# Patient Record
Sex: Female | Born: 1950
Health system: Southern US, Community
[De-identification: ages and names within clinical notes are randomized; demographics above are authoritative.]

## PROBLEM LIST (undated history)

## (undated) DIAGNOSIS — K8020 Calculus of gallbladder without cholecystitis without obstruction: Secondary | ICD-10-CM

## (undated) DIAGNOSIS — N200 Calculus of kidney: Secondary | ICD-10-CM

## (undated) DIAGNOSIS — G4730 Sleep apnea, unspecified: Secondary | ICD-10-CM

## (undated) DIAGNOSIS — I10 Essential (primary) hypertension: Secondary | ICD-10-CM

## (undated) DIAGNOSIS — M1990 Unspecified osteoarthritis, unspecified site: Secondary | ICD-10-CM

## (undated) DIAGNOSIS — K219 Gastro-esophageal reflux disease without esophagitis: Secondary | ICD-10-CM

## (undated) DIAGNOSIS — K5732 Diverticulitis of large intestine without perforation or abscess without bleeding: Secondary | ICD-10-CM

## (undated) HISTORY — DX: Unspecified osteoarthritis, unspecified site: M19.90

## (undated) HISTORY — PX: ESOPHAGOGASTRODUODENOSCOPY: SHX1529

## (undated) HISTORY — DX: Gastro-esophageal reflux disease without esophagitis: K21.9

## (undated) HISTORY — DX: Calculus of kidney: N20.0

## (undated) HISTORY — PX: COLONOSCOPY: SHX174

## (undated) HISTORY — PX: CHOLECYSTECTOMY: SHX55

## (undated) HISTORY — DX: Essential (primary) hypertension: I10

## (undated) HISTORY — PX: BREAST BIOPSY: SHX20

## (undated) HISTORY — PX: BREAST EXCISIONAL BIOPSY: SUR124

## (undated) HISTORY — DX: Calculus of gallbladder without cholecystitis without obstruction: K80.20

## (undated) HISTORY — PX: OOPHORECTOMY: SHX86

## (undated) HISTORY — PX: TONSILLECTOMY: SUR1361

## (undated) HISTORY — PX: EXPLORATORY LAPAROTOMY: SUR591

## (undated) HISTORY — DX: Diverticulitis of large intestine without perforation or abscess without bleeding: K57.32

## (undated) HISTORY — DX: Sleep apnea, unspecified: G47.30

---

## 1995-09-14 HISTORY — PX: LAPAROTOMY: SHX154

## 1995-09-14 HISTORY — PX: LAPAROSCOPIC ENDOMETRIOSIS FULGURATION: SUR769

## 1998-12-16 ENCOUNTER — Other Ambulatory Visit: Admission: RE | Admit: 1998-12-16 | Discharge: 1998-12-16 | Payer: Self-pay | Admitting: Obstetrics and Gynecology

## 1999-07-16 ENCOUNTER — Ambulatory Visit (HOSPITAL_COMMUNITY): Admission: RE | Admit: 1999-07-16 | Discharge: 1999-07-16 | Payer: Self-pay | Admitting: Emergency Medicine

## 1999-11-19 ENCOUNTER — Encounter: Payer: Self-pay | Admitting: Emergency Medicine

## 1999-11-19 ENCOUNTER — Encounter: Admission: RE | Admit: 1999-11-19 | Discharge: 1999-11-19 | Payer: Self-pay | Admitting: Emergency Medicine

## 1999-11-24 ENCOUNTER — Encounter: Admission: RE | Admit: 1999-11-24 | Discharge: 1999-11-24 | Payer: Self-pay | Admitting: Emergency Medicine

## 1999-11-24 ENCOUNTER — Encounter: Payer: Self-pay | Admitting: Emergency Medicine

## 2000-01-19 ENCOUNTER — Other Ambulatory Visit: Admission: RE | Admit: 2000-01-19 | Discharge: 2000-01-19 | Payer: Self-pay | Admitting: Obstetrics and Gynecology

## 2000-03-30 ENCOUNTER — Encounter: Payer: Self-pay | Admitting: Surgery

## 2000-03-31 ENCOUNTER — Encounter: Payer: Self-pay | Admitting: Surgery

## 2000-03-31 ENCOUNTER — Observation Stay (HOSPITAL_COMMUNITY): Admission: RE | Admit: 2000-03-31 | Discharge: 2000-04-01 | Payer: Self-pay | Admitting: Surgery

## 2000-11-29 ENCOUNTER — Encounter: Payer: Self-pay | Admitting: Emergency Medicine

## 2000-11-29 ENCOUNTER — Encounter: Admission: RE | Admit: 2000-11-29 | Discharge: 2000-11-29 | Payer: Self-pay | Admitting: Emergency Medicine

## 2001-04-07 ENCOUNTER — Other Ambulatory Visit: Admission: RE | Admit: 2001-04-07 | Discharge: 2001-04-07 | Payer: Self-pay | Admitting: Obstetrics and Gynecology

## 2001-08-08 ENCOUNTER — Ambulatory Visit (HOSPITAL_COMMUNITY): Admission: RE | Admit: 2001-08-08 | Discharge: 2001-08-08 | Payer: Self-pay

## 2001-12-12 ENCOUNTER — Encounter: Payer: Self-pay | Admitting: Obstetrics and Gynecology

## 2001-12-12 ENCOUNTER — Encounter: Admission: RE | Admit: 2001-12-12 | Discharge: 2001-12-12 | Payer: Self-pay | Admitting: Obstetrics and Gynecology

## 2002-04-12 ENCOUNTER — Other Ambulatory Visit: Admission: RE | Admit: 2002-04-12 | Discharge: 2002-04-12 | Payer: Self-pay | Admitting: Obstetrics and Gynecology

## 2003-01-01 ENCOUNTER — Encounter: Admission: RE | Admit: 2003-01-01 | Discharge: 2003-01-01 | Payer: Self-pay | Admitting: Obstetrics and Gynecology

## 2003-01-01 ENCOUNTER — Encounter: Payer: Self-pay | Admitting: Obstetrics and Gynecology

## 2003-03-12 ENCOUNTER — Encounter: Payer: Self-pay | Admitting: Gastroenterology

## 2003-04-25 ENCOUNTER — Other Ambulatory Visit: Admission: RE | Admit: 2003-04-25 | Discharge: 2003-04-25 | Payer: Self-pay | Admitting: Obstetrics and Gynecology

## 2003-06-17 ENCOUNTER — Encounter: Admission: RE | Admit: 2003-06-17 | Discharge: 2003-06-17 | Payer: Self-pay | Admitting: Obstetrics and Gynecology

## 2003-06-17 ENCOUNTER — Encounter: Payer: Self-pay | Admitting: Obstetrics and Gynecology

## 2004-01-02 ENCOUNTER — Encounter: Admission: RE | Admit: 2004-01-02 | Discharge: 2004-01-02 | Payer: Self-pay | Admitting: Obstetrics and Gynecology

## 2004-03-11 ENCOUNTER — Encounter: Payer: Self-pay | Admitting: Gastroenterology

## 2005-01-07 ENCOUNTER — Encounter: Admission: RE | Admit: 2005-01-07 | Discharge: 2005-01-07 | Payer: Self-pay | Admitting: Obstetrics and Gynecology

## 2005-06-22 ENCOUNTER — Ambulatory Visit: Payer: Self-pay | Admitting: Gastroenterology

## 2006-01-25 ENCOUNTER — Encounter: Admission: RE | Admit: 2006-01-25 | Discharge: 2006-01-25 | Payer: Self-pay | Admitting: Obstetrics and Gynecology

## 2006-06-09 ENCOUNTER — Encounter: Admission: RE | Admit: 2006-06-09 | Discharge: 2006-06-09 | Payer: Self-pay | Admitting: Obstetrics and Gynecology

## 2006-06-10 ENCOUNTER — Ambulatory Visit: Payer: Self-pay | Admitting: Internal Medicine

## 2006-07-07 ENCOUNTER — Ambulatory Visit: Payer: Self-pay | Admitting: Internal Medicine

## 2007-03-06 ENCOUNTER — Ambulatory Visit (HOSPITAL_COMMUNITY): Admission: RE | Admit: 2007-03-06 | Discharge: 2007-03-06 | Payer: Self-pay | Admitting: Obstetrics and Gynecology

## 2007-03-08 ENCOUNTER — Encounter: Admission: RE | Admit: 2007-03-08 | Discharge: 2007-03-08 | Payer: Self-pay | Admitting: Obstetrics and Gynecology

## 2007-10-24 ENCOUNTER — Ambulatory Visit: Payer: Self-pay | Admitting: Internal Medicine

## 2007-10-25 DIAGNOSIS — Z87898 Personal history of other specified conditions: Secondary | ICD-10-CM | POA: Insufficient documentation

## 2007-10-25 DIAGNOSIS — K449 Diaphragmatic hernia without obstruction or gangrene: Secondary | ICD-10-CM | POA: Insufficient documentation

## 2007-10-25 DIAGNOSIS — I10 Essential (primary) hypertension: Secondary | ICD-10-CM | POA: Insufficient documentation

## 2007-10-25 DIAGNOSIS — N809 Endometriosis, unspecified: Secondary | ICD-10-CM | POA: Insufficient documentation

## 2007-10-25 DIAGNOSIS — E669 Obesity, unspecified: Secondary | ICD-10-CM | POA: Insufficient documentation

## 2007-10-25 DIAGNOSIS — N3010 Interstitial cystitis (chronic) without hematuria: Secondary | ICD-10-CM | POA: Insufficient documentation

## 2007-10-25 DIAGNOSIS — K219 Gastro-esophageal reflux disease without esophagitis: Secondary | ICD-10-CM | POA: Insufficient documentation

## 2007-12-11 ENCOUNTER — Ambulatory Visit (HOSPITAL_COMMUNITY): Admission: RE | Admit: 2007-12-11 | Discharge: 2007-12-11 | Payer: Self-pay | Admitting: Internal Medicine

## 2008-03-18 ENCOUNTER — Encounter: Admission: RE | Admit: 2008-03-18 | Discharge: 2008-03-18 | Payer: Self-pay | Admitting: Obstetrics and Gynecology

## 2009-03-20 ENCOUNTER — Encounter: Admission: RE | Admit: 2009-03-20 | Discharge: 2009-03-20 | Payer: Self-pay | Admitting: Obstetrics and Gynecology

## 2010-04-20 ENCOUNTER — Encounter: Admission: RE | Admit: 2010-04-20 | Discharge: 2010-04-20 | Payer: Self-pay | Admitting: Obstetrics and Gynecology

## 2010-09-13 DIAGNOSIS — K5732 Diverticulitis of large intestine without perforation or abscess without bleeding: Secondary | ICD-10-CM

## 2010-09-13 HISTORY — DX: Diverticulitis of large intestine without perforation or abscess without bleeding: K57.32

## 2010-10-04 ENCOUNTER — Encounter: Payer: Self-pay | Admitting: Obstetrics and Gynecology

## 2011-01-26 NOTE — Assessment & Plan Note (Signed)
[facility]                         GASTROENTEROLOGY OFFICE NOTE   Mckenzie Vasquez, Mckenzie Vasquez              MRN:          [id number]  DATE:10/24/2007                            DOB:          Feb 04, 1951    PROBLEMS:  1. Gastroesophageal reflux disease.  2. Hiatal hernia 6 cm.  Esophagogastroduodenoscopy by Dr. Jarold Motto      March 11, 2004.  3. Normal colonoscopy March 11, 2004.  4. Obesity.  5. Endometriosis with two exploratory laparotomies.  6. Prior cholecystectomy.  7. Fibrocystic breast disease.  8. Prior tonsillectomy.  9. Hypertension.  10.Interstitial cystitis.  11.Asthma and allergic rhinitis.   Ms. Wassmer is doing well, except for some intermittent chest pain which is  rare.  She takes Nexium 40 mg b.i.d. which controls heartburn symptoms.  She remains busy with [employer].  There is no dysphagia.  There is no weight loss, in fact, the weight has increased.  She is  aware of the need to lose weight.   MEDICATIONS:  1. Lisinopril/hydrochlorothiazide 10/12.5 mg daily.  2. Nexium 40 mg b.i.d.  3. Advair 250/50 b.i.d.  4. Calcium two daily.  5. Fish oil two daily.  6. Multivitamin daily.   ALLERGIES:  Allergic to SULFA.   VITAL SIGNS:  Weight 211 pounds, pulse 78, blood pressure 112/84.   ASSESSMENT:  Gastroesophageal reflux disease with known hiatal hernia.  Her symptoms are under good control with the exception of this chest  pain which occurs intermittently.  She says she has had this evaluated  and it is not cardiac.  It could be the hernia.  We spent time,  approximately 15-20 minutes discussing gastroesophageal reflux disease  and hiatal hernia.  She might be one of these people that could benefit  from a hiatal hernia repair.  However, she is obese and weight loss may  help her a lot as well.  We are going to go ahead and check an upper  gastrointestinal series to reassess the hernia itself and make sure  there is  no torsion or volvulus or paraesophageal component which could  be an absolute indication for repair.  She will think about and try to  lose weight.  I will discuss the results of the upper GI with her.  We  will  refill her Nexium for the time being.  I did briefly discuss the process  of a laparoscopic fundoplication and the possible side effects, etc.     Iva Boop, MD,FACG  Electronically Signed    CEG/MedQ  DD: 10/24/2007  DT: 10/26/2007  Job #: [id number]   cc:   [facility]

## 2011-01-29 NOTE — Assessment & Plan Note (Signed)
[facility]                           GASTROENTEROLOGY OFFICE NOTE   TANECIA, MCCAY                       MRN:          [id number]  DATE:07/07/2006                            DOB:          1951/06/06    Ms. Mckenzie Vasquez returns for follow up.  She had a syndrome of pain and change in  bowel habits with mainly diarrhea.  She was treated with Cipro and Flagyl.  We thought she probably had diverticulitis, though diarrhea is not a typical  feature of that.  She says she feels basically resolved, though she has had  some persistent loose stools that have resolved over the last week.   See note of June 10, 2006, for full details of recent problems.   Weight 197 pounds, pulse 84, blood pressure 110/80.  Abdomen is soft,  minimally tender in the left lower quadrant area.   ASSESSMENT:  Recent syndrome, likely was diverticulitis, though cannot be  sure.  At any rate, whatever this was is resolved at this point, it seems.  I think some of her loose stools could have been related to antibiotic  disruption of her bacterial flora of the colon.   PLAN:  Follow up as needed.  Certainly if symptoms recur, she needs to let  us know.     Iva Boop, MD,FACG    CEG/MedQ  DD: 07/07/2006  DT: 07/08/2006  Job #: [id number]

## 2011-01-29 NOTE — Assessment & Plan Note (Signed)
[facility]                           GASTROENTEROLOGY OFFICE NOTE   Mckenzie Vasquez, Vasquez                       MRN:          [id number]  DATE:06/10/2006                            DOB:          10-04-1950    PROBLEM:  Left lower quadrant pain.   HISTORY:  Mckenzie Vasquez Vasquez is a pleasant 60 year old white female known to Dr.  Jarold Motto who had colonoscopy in 2005 which was done for complaint of  abdominal bloating and hemoccult-positive stool.  This was a normal exam,  however, was a very difficult exam due to multiple adhesions.  It was not  noted that she had any evidence of diverticular disease.  Mckenzie Vasquez patient has  history of severe endometriosis and is status post hysterectomy and  oophorectomy.  She says that she started about a week ago with some diarrhea  which was not severe, took Imodium which helped, and those symptoms  resolved.  She did not have any associated fever, chills, etc.  She had some  vague nausea.  Earlier this week, she says she generally just did not feel  good; however, was not having any obvious symptoms.  By Wednesday, however,  she says she started feeling just awful and developed lower abdominal  discomfort which eventually became fairly severe, crampy, left-sided  abdominal pain.  She went to her gynecologist because she had problems with  ovarian cysts in Mckenzie Vasquez past but was told that this did not appear to be a GYN  problem.  She actually has had a left oophorectomy.  She had a UA done which  did show some white cells according to Mckenzie Vasquez patient.  We do not have that  lab.  Was started on Cipro and hydrocodone and then had a renal ultrasound  done yesterday to rule out ureteral lithiasis, and this was negative as  well.  She says she really has not had any symptoms as far as urinary  urgency or frequency or dysuria, but says that if her bladder is full, she  has been a bit uncomfortable.  She continues to have this constant left  lower  quadrant pain which is actually a bit better over Mckenzie Vasquez past 24 hours.  She did have an episode of nausea and vomiting yesterday, none today, and  has kept down liquids today.  She has not had any fever over Mckenzie Vasquez past 24  hours, no diarrhea, no melena or hematochezia.   CURRENT MEDICATIONS:  1. Lisinopril/hydrochlorothiazide daily.  2. Calcium supplement daily.  3. Multivitamins daily.  4. Nexium 40 mg b.i.d.   ALLERGIES:  SULFA causes swelling.   PHYSICAL EXAMINATION:  GENERAL: Well-developed white female in no acute  distress.  VITAL SIGNS:  Weight is 197, pulse 56, blood pressure 108/70.  CARDIOVASCULAR:  Regular rate and rhythm with S1 and S2.  PULMONARY:  Clear.  ABDOMEN: Soft.  She is tender in Mckenzie Vasquez left lower quadrant and mildly in Mckenzie Vasquez  suprapubic area.  There is no guarding or rebound.  Bowel sounds are active.  There is no palpable mass or hepatosplenomegaly.  RECTAL:  Exam was not done today.  IMPRESSION:  42. A 60 year old female with a 3-day history of left lower quadrant pain.      Suspect she does have diverticulitis.  She is improving on Cipro.      Therefore, will continue to treat her with Cipro 500 mg b.i.d. x10      days, Flagyl 250 four times a day x10 days.  She was given a      prescription for Phenergan 25 mg q. 6 h p.r.n. for nausea.  Asked to      start a full liquid diet and then gradually advance over Mckenzie Vasquez next      couple of days.  She is to follow up with Dr. Jarold Motto in 3 weeks and      is asked to call us back on Monday, October 1, with progress report.      If she is not significantly improved at that time, will need CT scan of      Mckenzie Vasquez abdomen and pelvis.      ______________________________  Mike Gip, PA-C    ______________________________  Iva Boop, MD,FACG     AE/MedQ  DD:  06/10/2006  DT:  06/13/2006  Job #:  [id number]

## 2011-03-23 ENCOUNTER — Other Ambulatory Visit: Payer: Self-pay | Admitting: Obstetrics and Gynecology

## 2011-03-23 DIAGNOSIS — Z1231 Encounter for screening mammogram for malignant neoplasm of breast: Secondary | ICD-10-CM

## 2011-05-10 ENCOUNTER — Ambulatory Visit
Admission: RE | Admit: 2011-05-10 | Discharge: 2011-05-10 | Disposition: A | Discharge status: Home / Self Care | Payer: BC Managed Care – PPO | Source: Ambulatory Visit | Attending: Obstetrics and Gynecology | Admitting: Obstetrics and Gynecology

## 2011-05-10 DIAGNOSIS — Z1231 Encounter for screening mammogram for malignant neoplasm of breast: Secondary | ICD-10-CM

## 2011-10-05 ENCOUNTER — Encounter: Payer: Self-pay | Admitting: Internal Medicine

## 2011-10-25 ENCOUNTER — Other Ambulatory Visit: Payer: Self-pay | Admitting: Physician Assistant

## 2011-11-05 ENCOUNTER — Telehealth: Payer: Self-pay | Admitting: *Deleted

## 2011-11-05 ENCOUNTER — Ambulatory Visit (AMBULATORY_SURGERY_CENTER): Payer: BC Managed Care – PPO | Admitting: *Deleted

## 2011-11-05 VITALS — Ht 63.0 in | Wt 214.0 lb

## 2011-11-05 DIAGNOSIS — K5792 Diverticulitis of intestine, part unspecified, without perforation or abscess without bleeding: Secondary | ICD-10-CM

## 2011-11-05 DIAGNOSIS — Z800 Family history of malignant neoplasm of digestive organs: Secondary | ICD-10-CM

## 2011-11-05 DIAGNOSIS — Z1211 Encounter for screening for malignant neoplasm of colon: Secondary | ICD-10-CM

## 2011-11-05 DIAGNOSIS — K5732 Diverticulitis of large intestine without perforation or abscess without bleeding: Secondary | ICD-10-CM

## 2011-11-05 MED ORDER — PEG-KCL-NACL-NASULF-NA ASC-C 100 G PO SOLR: ORAL | Status: DC

## 2011-11-05 NOTE — Telephone Encounter (Signed)
         [name], [name] - 11/05/11 ','<More Detail >>       Iva Boop, MD         Sent:  Mckenzie Vasquez November 05, 2011 3:34 PM   To:  [name], RN                 Message     Looks like three relatives with colon cancer so ok to do colonoscopy I guess she switched to me so ok for me to do colonoscopy ----- Message ----- From: [name], RN Sent: 11/05/2011 9:12 AM To: Iva Boop, MD  Please review patient's chart. You have seen her in the office several times, last colonoscopy with Dr.Patterson 2005 normal. She does have hx diverticulitis and family hx colon cancer. Pt denies any GI symptoms at this time but she was told to have colonoscopy during her previous office visit per pt. I'm unsure when her recall is unable to find in chart. Is it okay for patient to have colonoscopy at this time? Is it okay she is scheduled with you not Dr.Patterson?(pt was fine with you doing her colonoscopy since she has seen you in office) please advise,Demarie Uhlig [name]         Attached Reports     The sender attached the following reports to this message:

## 2011-11-16 ENCOUNTER — Ambulatory Visit (AMBULATORY_SURGERY_CENTER): Payer: BC Managed Care – PPO | Admitting: Internal Medicine

## 2011-11-16 ENCOUNTER — Encounter: Payer: Self-pay | Admitting: Internal Medicine

## 2011-11-16 VITALS — BP 139/84 | HR 80 | Temp 98.2°F | Resp 19 | Ht 63.0 in | Wt 214.0 lb

## 2011-11-16 DIAGNOSIS — Z1211 Encounter for screening for malignant neoplasm of colon: Secondary | ICD-10-CM

## 2011-11-16 DIAGNOSIS — R109 Unspecified abdominal pain: Secondary | ICD-10-CM

## 2011-11-16 MED ORDER — SODIUM CHLORIDE 0.9 % IV SOLN: 500.0000 mL | INTRAVENOUS | Status: DC

## 2011-11-16 NOTE — Patient Instructions (Signed)

## 2011-11-16 NOTE — Progress Notes (Signed)
UNABLE TO ADVANCE SCOPE , CHANGE TO ADULT SCOPE AFTER 12 MINS

## 2011-11-16 NOTE — Progress Notes (Signed)
Patient did not experience any of the following events: a burn prior to discharge; a fall within the facility; wrong site/side/patient/procedure/implant event; or a hospital transfer or hospital admission upon discharge from the facility. (G8907) Patient did not have preoperative order for IV antibiotic SSI prophylaxis. (G8918) Patient did not have preoperative order for IV antibiotic SSI prophylaxis. (G8918)  

## 2011-11-16 NOTE — Progress Notes (Signed)
CHANGE TO ADULT SCOPE, UNABLE TO COMPLETE EXAM RELATED TO PATIENT DISCOMFORT. ONCE SCOPE WITHDRAWN, PATIENT SLEEPING. ABD SOFT, SKIN WARM DRY PINK, VS STABLE.

## 2011-11-16 NOTE — Op Note (Signed)
[facility] [address] [place], [place]  [address]  COLONOSCOPY PROCEDURE REPORT  PATIENT:  Mckenzie Vasquez, Mckenzie Vasquez  MR#:  [id number] BIRTHDATE:  11-21-1950, 60 yrs. old  GENDER:  female ENDOSCOPIST:  Iva Boop, MD, [facility] REF. BY:  [name], M.D. PROCEDURE DATE:  11/16/2011 PROCEDURE:  Incomplete colonoscopy ASA CLASS:  Class II INDICATIONS:  Screening patient presented for screening in setting of recurrent abdominal pain (chronic) thought to be diverticulitis  MEDICATIONS:   These medications were titrated to patient response per physician's verbal order, Fentanyl 100 mcg, Versed 10 mg IV, Benadryl 12.5 mg IV  DESCRIPTION OF PROCEDURE:   After the risks benefits and alternatives of the procedure were thoroughly explained, informed consent was obtained.  Digital rectal exam was performed and revealed no abnormalities.   The LB [id number]-[id number] endoscope was introduced through the anus and advanced to the descending colon, limited by extreme patient discomfort, a redundant colon. The quality of the prep was excellent, using MoviPrep.  The instrument was then slowly withdrawn as the colon was fully examined. <<PROCEDUREIMAGES>>  FINDINGS:  Normal left colon and rectum. No diverticulosis.Extremely uncomfortable with scope passage in left colon. Pediatric and adult colonoscopes used, position changes and pressure used but not successful at moving beyond descending colon.  Retroflexed views in the rectum revealed no abnormalities. The scope was then withdrawn and the procedure completed. COMPLICATIONS:  None ENDOSCOPIC IMPRESSION: 1) Normal left colon - did not tolerate exam beyond this point. 2) No left-sided diverticulosis seen RECOMMENDATIONS: Schedule an office visit to discuss abdominal pain and to consider repeating colonoscopy exam - with MAC sedation.  Iva Boop, MD, Clementeen Graham  CC:  [name], MD, Olivia Mackie, MD and The  Patient  n. eSIGNED:   Iva Boop at 11/16/2011 01:01 PM  Rosie Fate, [phone]

## 2011-11-17 ENCOUNTER — Telehealth: Payer: Self-pay | Admitting: *Deleted

## 2011-11-17 NOTE — Telephone Encounter (Signed)
  Follow up Call-  Call back number 11/16/2011  Post procedure Call Back phone  # [phone] ext [phone]  Permission to leave phone message Yes     Patient questions:  Do you have a fever, pain , or abdominal swelling? no Pain Score  0 *  Have you tolerated food without any problems? yes  Have you been able to return to your normal activities? yes  Do you have any questions about your discharge instructions: Diet   no Medications  no Follow up visit  no  Do you have questions or concerns about your Care? no  Actions: * If pain score is 4 or above: No action needed, pain <4.

## 2011-11-28 ENCOUNTER — Ambulatory Visit (INDEPENDENT_AMBULATORY_CARE_PROVIDER_SITE_OTHER): Payer: BC Managed Care – PPO | Admitting: Family Medicine

## 2011-11-28 VITALS — BP 120/81 | HR 109 | Temp 98.1°F | Resp 20 | Ht 63.0 in | Wt 212.0 lb

## 2011-11-28 DIAGNOSIS — R062 Wheezing: Secondary | ICD-10-CM

## 2011-11-28 DIAGNOSIS — J40 Bronchitis, not specified as acute or chronic: Secondary | ICD-10-CM

## 2011-11-28 MED ORDER — DOXYCYCLINE HYCLATE 100 MG PO CAPS: 100.0000 mg | ORAL_CAPSULE | Freq: Two times a day (BID) | ORAL | Status: AC

## 2011-11-28 NOTE — Progress Notes (Signed)
  Patient Name: Mckenzie Vasquez Date of Birth: Apr 26, 1951 Medical Record Number: [id number] Gender: female Date of Encounter: 11/28/2011  History of Present Illness:  Mckenzie Vasquez is a 61 y.o. very pleasant female patient who presents with the following:  Noted the onset of "cold" type symptoms about 3 days ago- temperature up to about 99 degrees.  She has noted body aches as well, nasal congestion, cough- non- productive.   Had a scratchy throat - this is better.  No earache.  No stomach symptoms.  Her symptoms are affecting her asthma.    She used albuterol prn and does have some advair but does not use it regularly.  She is actually out of albuterol  She has felt very, very tired and notes that her right lung feels congested.    Patient Active Problem List  Diagnoses  . OBESITY  . HYPERTENSION, BENIGN  . GERD  . HIATAL HERNIA  . INTERSTITIAL CYSTITIS  . ENDOMETRIOSIS  . FIBROCYSTIC BREAST DISEASE, HX OF   Past Medical History  Diagnosis Date  . Hypertension   . GERD (gastroesophageal reflux disease)   . Asthma   . Diverticulitis large intestine 2012    pt. states this is ongoing and sometimes worse than others   Past Surgical History  Procedure Date  . Cholecystectomy   . Breast biopsy     left  . Tonsillectomy   . Exploratory laparotomy     x2; oophorectomyx1  . Laparotomy   . Laparoscopic endometriosis fulguration 1997   History  Substance Use Topics  . Smoking status: Never Smoker   . Smokeless tobacco: Never Used  . Alcohol Use: No   Family History  Problem Relation Age of Onset  . Colon cancer Maternal Grandfather   . Colon cancer Other   . Colon cancer Paternal Grandfather    Allergies  Allergen Reactions  . Sulfa Antibiotics Rash    Medication list has been reviewed and updated.  Review of Systems: As per HPI- otherwise negative. She took some dayquil this am  Physical Examination: Filed Vitals:   11/28/11 1646  BP: 120/81  Pulse: 109    Temp: 98.1 F (36.7 C)  TempSrc: Oral  Resp: 20  Height: 5\' 3"  (1.6 m)  Weight: 212 lb (96.163 kg)    Body mass index is 37.55 kg/(m^2).  GEN: WDWN, NAD, Non-toxic, A & O x 3, obese HEENT: Atraumatic, Normocephalic. Neck supple. No masses, No LAD.  TM wnl, oropharynx wnl Ears and Nose: No external deformity. CV: RRR, No M/G/R. No JVD. No thrill. No extra heart sounds. PULM: minimal ronchi and expiratory wheezes. No retractions. No resp. distress. No accessory muscle use. ABD: S, NT, ND, +BS. No rebound. No HSM. EXTR: No c/c/e NEURO Normal gait.  PSYCH: Normally interactive. Conversant. Not depressed or anxious appearing.  Calm demeanor.   Assessment and Plan: 1. Bronchitis  doxycycline (VIBRAMYCIN) 100 MG capsule  2. Wheezing     Cover with doxy for bronchitis as above. Patient (or parent if minor) instructed to return to clinic or call if not better in 2-3 day(s).

## 2011-11-29 ENCOUNTER — Other Ambulatory Visit: Payer: Self-pay | Admitting: Internal Medicine

## 2011-11-29 MED ORDER — FLUTICASONE-SALMETEROL 250-50 MCG/DOSE IN AEPB: 1.0000 | INHALATION_SPRAY | Freq: Two times a day (BID) | RESPIRATORY_TRACT | Status: DC

## 2011-11-30 ENCOUNTER — Other Ambulatory Visit: Payer: Self-pay

## 2011-11-30 MED ORDER — MOMETASONE FURO-FORMOTEROL FUM 200-5 MCG/ACT IN AERO: 1.0000 | INHALATION_SPRAY | Freq: Two times a day (BID) | RESPIRATORY_TRACT | Status: DC

## 2012-02-15 ENCOUNTER — Encounter: Payer: Self-pay | Admitting: Internal Medicine

## 2012-05-03 ENCOUNTER — Other Ambulatory Visit: Payer: Self-pay | Admitting: Obstetrics and Gynecology

## 2012-05-03 DIAGNOSIS — Z1231 Encounter for screening mammogram for malignant neoplasm of breast: Secondary | ICD-10-CM

## 2012-05-05 ENCOUNTER — Ambulatory Visit (INDEPENDENT_AMBULATORY_CARE_PROVIDER_SITE_OTHER): Payer: BC Managed Care – PPO | Admitting: Family Medicine

## 2012-05-05 VITALS — BP 118/70 | HR 94 | Temp 98.7°F | Resp 16 | Ht 63.0 in | Wt 197.6 lb

## 2012-05-05 DIAGNOSIS — K649 Unspecified hemorrhoids: Secondary | ICD-10-CM

## 2012-05-05 DIAGNOSIS — L290 Pruritus ani: Secondary | ICD-10-CM

## 2012-05-05 MED ORDER — HYDROCORTISONE 2.5 % RE CREA: TOPICAL_CREAM | Freq: Two times a day (BID) | RECTAL | Status: AC

## 2012-05-05 MED ORDER — HYDROCORTISONE ACETATE 30 MG RE SUPP: 1.0000 | Freq: Two times a day (BID) | RECTAL | Status: DC

## 2012-05-05 NOTE — Progress Notes (Signed)
[facility]:  Office Visit  Chief Complaint:  Chief Complaint  Patient presents with  . Rectal Pain    irritation is spreading to vagina, burning when urinate x 1 week    HPI: Mckenzie Vasquez is a 61 y.o. female who complains of  hemorrhoids and anal itching. Sitz  baths and OTC hydrocortisone cream, fiber and water without relief.   Past Medical History  Diagnosis Date  . Hypertension   . GERD (gastroesophageal reflux disease)   . Asthma   . Diverticulitis large intestine 2012    pt. states this is ongoing and sometimes worse than others   Past Surgical History  Procedure Date  . Cholecystectomy   . Breast biopsy     left  . Tonsillectomy   . Exploratory laparotomy     x2; oophorectomyx1  . Laparotomy   . Laparoscopic endometriosis fulguration 1997   History   Social History  . Marital Status: Married    Spouse Name: N/A    Number of Children: N/A  . Years of Education: N/A   Social History Main Topics  . Smoking status: Never Smoker   . Smokeless tobacco: Never Used  . Alcohol Use: No  . Drug Use: No  . Sexually Active:    Other Topics Concern  . None   Social History Narrative  . None   Family History  Problem Relation Age of Onset  . Colon cancer Maternal Grandfather   . Colon cancer Other   . Colon cancer Paternal Grandfather    Allergies  Allergen Reactions  . Sulfa Antibiotics Rash   Prior to Admission medications   Medication Sig Start Date End Date Taking? Authorizing Provider  Albuterol Sulfate (PROAIR HFA IN) Inhale 1 Inhaler into the lungs as needed.   Yes Historical Provider, MD  esomeprazole (NEXIUM) 40 MG capsule Take 40 mg by mouth daily before breakfast.   Yes Historical Provider, MD  Estradiol (VAGIFEM VA) Place 1 application vaginally daily.   Yes Historical Provider, MD  Fluticasone-Salmeterol (ADVAIR DISKUS) 250-50 MCG/DOSE AEPB Inhale 1 puff into the lungs 2 (two) times daily. 11/29/11  Yes Rickard Patience,  PA-C  lisinopril (PRINIVIL,ZESTRIL) 10 MG tablet Take 10 mg by mouth daily.   Yes Historical Provider, MD  OPTIVAR 0.05 % ophthalmic solution INSTILL 1 DROP IN [facility] EYE TWICE DAILY. 10/25/11  Yes Ryan M Dunn, PA-C  hydrocortisone (ANUSOL-HC) 2.5 % rectal cream Place rectally 2 (two) times daily. 05/05/12 05/15/12  Kema Santaella P Samrat Hayward, DO  HYDROCORTISONE ACE, RECTAL, 30 MG SUPP Place 1 suppository (30 mg total) rectally 2 (two) times daily. 05/05/12   Antonieta Slaven P Esmond Hinch, DO  Mometasone Furo-Formoterol Fum 200-5 MCG/ACT AERO Inhale 1 puff into the lungs 2 (two) times daily. 11/30/11   Rickard Patience, PA-C     ROS: The patient denies fevers, chills, night sweats, unintentional weight loss, chest pain, palpitations, wheezing, dyspnea on exertion, nausea, vomiting, abdominal pain, dysuria, hematuria, melena, numbness, weakness, or tingling.  All other systems have been reviewed and were otherwise negative with the exception of those mentioned in the HPI and as above.    PHYSICAL EXAM: Filed Vitals:   05/05/12 1553  BP: 118/70  Pulse: 94  Temp: 98.7 F (37.1 C)  Resp: 16   Filed Vitals:   05/05/12 1553  Height: 5\' 3"  (1.6 m)  Weight: 197 lb 9.6 oz (89.631 kg)   Body mass index is 35.00 kg/(m^2).  General: Alert, no acute distress  HEENT:  Normocephalic, atraumatic, oropharynx patent.  Cardiovascular:  Regular rate and rhythm, no rubs murmurs or gallops.  No Carotid bruits, radial pulse intact. No pedal edema.  Respiratory: Clear to auscultation bilaterally.  No wheezes, rales, or rhonchi.  No cyanosis, no use of accessory musculature GI: No organomegaly, abdomen is soft and non-tender, positive bowel sounds.  No masses. Skin: + hemorrhoid, red rash on anus, not thrombosed hemorrhoid. Neurologic: Facial musculature symmetric. Psychiatric: Patient is appropriate throughout our interaction. Lymphatic: No cervical lymphadenopathy Musculoskeletal: Gait intact.   LABS: No results found for this or any previous  visit.   EKG/XRAY:   Primary read interpreted by [facility] Conley Rolls at [facility].   ASSESSMENT/PLAN: Encounter Diagnoses  Name Primary?  Marland Kitchen Anal itching Yes  . Hemorrhoids     Rx Hydrocortisone cream and suppositories. She wants to hold off on getting lidocaine or dibucaine ointment if worse   Asmara Backs PHUONG, DO 05/05/2012 4:42 PM

## 2012-05-10 ENCOUNTER — Telehealth: Payer: Self-pay

## 2012-05-10 MED ORDER — HYDROCORTISONE 2.5 % RE CREA: TOPICAL_CREAM | Freq: Two times a day (BID) | RECTAL | Status: AC

## 2012-05-10 NOTE — Telephone Encounter (Signed)
I have called patient to advise it has been sent in/ per Dr Conley Rolls.

## 2012-05-10 NOTE — Telephone Encounter (Signed)
Pt states she received a 2 refills of HYDROCORTISONE ACE, RECTAL, 30 MG SUPP [[id number]] but no refills on the hydrocortisone (ANUSOL-HC) 2.5 % rectal cream [[id number]]  which Dr. Conley Rolls mentioned she would do. Pt needs a refill of the cream.  [facility] - [place], [place] - [address]  Best [phone]

## 2012-05-24 ENCOUNTER — Ambulatory Visit
Admission: RE | Admit: 2012-05-24 | Discharge: 2012-05-24 | Disposition: A | Discharge status: Home / Self Care | Payer: BC Managed Care – PPO | Source: Ambulatory Visit | Attending: Obstetrics and Gynecology | Admitting: Obstetrics and Gynecology

## 2012-05-24 DIAGNOSIS — Z1231 Encounter for screening mammogram for malignant neoplasm of breast: Secondary | ICD-10-CM

## 2012-07-01 ENCOUNTER — Ambulatory Visit (INDEPENDENT_AMBULATORY_CARE_PROVIDER_SITE_OTHER): Payer: BC Managed Care – PPO | Admitting: *Deleted

## 2012-07-01 DIAGNOSIS — Z23 Encounter for immunization: Secondary | ICD-10-CM

## 2012-09-22 ENCOUNTER — Other Ambulatory Visit: Payer: Self-pay | Admitting: Radiology

## 2012-09-22 NOTE — Telephone Encounter (Signed)
Please advise on refill of Protozone 2.5% cream pended.

## 2012-11-07 ENCOUNTER — Encounter: Payer: Self-pay | Admitting: Internal Medicine

## 2012-11-07 ENCOUNTER — Ambulatory Visit (INDEPENDENT_AMBULATORY_CARE_PROVIDER_SITE_OTHER): Payer: BC Managed Care – PPO | Admitting: Internal Medicine

## 2012-11-07 VITALS — BP 155/87 | HR 86 | Temp 98.5°F | Resp 16 | Ht 63.0 in | Wt 216.0 lb

## 2012-11-07 DIAGNOSIS — M79643 Pain in unspecified hand: Secondary | ICD-10-CM

## 2012-11-07 DIAGNOSIS — M25549 Pain in joints of unspecified hand: Secondary | ICD-10-CM

## 2012-11-07 DIAGNOSIS — M2550 Pain in unspecified joint: Secondary | ICD-10-CM

## 2012-11-07 DIAGNOSIS — G2582 Stiff-man syndrome: Secondary | ICD-10-CM

## 2012-11-07 DIAGNOSIS — J45909 Unspecified asthma, uncomplicated: Secondary | ICD-10-CM | POA: Insufficient documentation

## 2012-11-07 DIAGNOSIS — Z6838 Body mass index (BMI) 38.0-38.9, adult: Secondary | ICD-10-CM | POA: Insufficient documentation

## 2012-11-07 DIAGNOSIS — R5381 Other malaise: Secondary | ICD-10-CM

## 2012-11-07 DIAGNOSIS — R5383 Other fatigue: Secondary | ICD-10-CM

## 2012-11-07 LAB — POCT CBC
Granulocyte percent: 51.7 %G (ref 37–80)
Lymph, poc: 3.6 — AB (ref 0.6–3.4)
MCH, POC: 28.9 pg (ref 27–31.2)
MCHC: 32 g/dL (ref 31.8–35.4)
MCV: 90.3 fL (ref 80–97)
MID (cbc): 0.7 (ref 0–0.9)
POC LYMPH PERCENT: 40.5 %L (ref 10–50)
Platelet Count, POC: 335 10*3/uL (ref 142–424)
RDW, POC: 13.5 %

## 2012-11-07 NOTE — Progress Notes (Addendum)
Subjective:    Patient ID: Mckenzie Vasquez, female    DOB: 03-03-1951, 62 y.o.   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 Currently affecting hands, right greater than left (she is right-handed). Has had problems with both feet but not currently. Has also had marked stiffness in the back. Has had problems with the left knee, the right he, the left heel, and the right elbow. Never swollen. Never red or hot. No family history of arthritis.  Sleep= snores loudly/no problems driving/no problems reading in the afternoon/occasionally falls asleep watching TV  This has taken place over the last 6-8 months. She also has easy fatigability with activity. There is no shortness of breath.  Past medical history includes Patient Active Problem List  Diagnosis  . OBESITY  . HYPERTENSION, BENIGN  . GERD  . HIATAL HERNIA  . INTERSTITIAL CYSTITIS  . ENDOMETRIOSIS  . FIBROCYSTIC BREAST DISEASE, HX OF    -  Asthma  Medication includes lisinopril, Nexium, albuterol, Advair    Review of Systems  Constitutional: Negative for fever, chills, activity change, appetite change and unexpected weight change.  HENT: Negative for hearing loss, trouble swallowing, neck pain and neck stiffness.   Respiratory: Negative for choking.   Cardiovascular: Negative for chest pain, palpitations and leg swelling.  Gastrointestinal: Negative for nausea, diarrhea and constipation.  Endocrine: Negative for cold intolerance, heat intolerance, polydipsia, polyphagia and polyuria.  Genitourinary: Negative for frequency and difficulty urinating.       UTI in January  Skin: Negative for rash.  Neurological: Negative for dizziness, tremors, weakness, light-headedness and headaches.  Psychiatric/Behavioral: Negative for agitation. The patient is not nervous/anxious.        Objective:   Physical  Exam Blood pressure 155/87 BMI 38 HEENT clear without thyromegaly or lymphadenopathy Heart regular without murmur Extremities-good peripheral pulses Trace edema in the lower chimneys Right elbow exam normal Right hand with tenderness in MCPs 2 and 3 and 4/left MCPs 3 and 4-no swelling or redness Tinel's is negative/no wrist pain with range of motion Brachioradialis nontender Shoulders with good range of motion without pain  Neck with good range of motion without pain No sensory or motor losses in the hands/radial pulses intact/no muscle atrophy Left Achilles nontender/this was a site of pain recently       Assessment & Plan:  Problem #1 multiple arthralgias  Problem #2 stiffness  Problem #3 poor sleep  Problem #4 fatigue  Problem #5 HTN-not currently controlled Problem #6 BMI 38  The differential could include polymyalgia rheumatica, myasthenia gravis, other autoimmune phenomena, other rheumatoid disorders, sleep deprivation syndrome. Start with routine labs  Results for orders placed in visit on 11/07/12  POCT CBC      Result Value Range   WBC 8.9  4.6 - 10.2 K/uL   Lymph, poc 3.6 (*) 0.6 - 3.4   POC LYMPH PERCENT 40.5  10 - 50 %L   MID (cbc) 0.7  0 - 0.9   POC MID % 7.8  0 - 12 %M   POC Granulocyte 4.6  2 - 6.9   Granulocyte percent 51.7  37 - 80 %G   RBC 4.74  4.04 - 5.48 M/uL   Hemoglobin 13.7  12.2 - 16.2 g/dL   HCT, POC 14.7  82.9 - 47.9 %   MCV 90.3  80 - 97 fL   MCH, POC 28.9  27 - 31.2 pg   MCHC 32.0  31.8 - 35.4 g/dL   RDW, POC 09.8     Platelet Count, POC 335  142 - 424 K/uL   MPV 8.3  0 - 99.8 fL  POCT SEDIMENTATION RATE      Result Value Range   POCT SED RATE 10  0 - 22 mm/hr     Addend 11/15/12: Better this week Labs wnl Start diary of pain and trial of melox 15 To [place] 12/04/13

## 2012-11-08 LAB — TSH: TSH: 3.202 u[IU]/mL (ref 0.350–4.500)

## 2012-11-08 LAB — COMPREHENSIVE METABOLIC PANEL
Alkaline Phosphatase: 50 U/L (ref 39–117)
BUN: 15 mg/dL (ref 6–23)
CO2: 29 mEq/L (ref 19–32)
Creat: 0.87 mg/dL (ref 0.50–1.10)
Glucose, Bld: 87 mg/dL (ref 70–99)
Total Bilirubin: 0.3 mg/dL (ref 0.3–1.2)

## 2012-11-08 LAB — RHEUMATOID FACTOR: Rhuematoid fact SerPl-aCnc: <10 IU/mL (ref <=14)

## 2012-11-08 LAB — URIC ACID: Uric Acid, Serum: 6.5 mg/dL (ref 2.4–7.0)

## 2012-11-08 LAB — T4, FREE: Free T4: 1.21 ng/dL (ref 0.80–1.80)

## 2012-11-09 LAB — ANA: Anti Nuclear Antibody(ANA): NEGATIVE

## 2012-11-15 ENCOUNTER — Telehealth: Payer: Self-pay | Admitting: Internal Medicine

## 2012-11-15 ENCOUNTER — Encounter: Payer: Self-pay | Admitting: Internal Medicine

## 2012-11-15 MED ORDER — MELOXICAM 15 MG PO TABS: 15.0000 mg | ORAL_TABLET | Freq: Every day | ORAL | Status: DC

## 2012-11-15 NOTE — Telephone Encounter (Signed)
Message copied by Tonye Pearson on Wed Nov 15, 2012 10:41 AM ------      Message from: Tonye Pearson      Created: Tue Nov 14, 2012 10:04 PM       call ------

## 2012-11-15 NOTE — Addendum Note (Signed)
Addended by: Tonye Pearson on: 11/15/2012 01:17 PM   Modules accepted: Orders

## 2012-11-28 ENCOUNTER — Telehealth: Payer: Self-pay

## 2012-11-28 DIAGNOSIS — K649 Unspecified hemorrhoids: Secondary | ICD-10-CM

## 2012-11-28 DIAGNOSIS — L290 Pruritus ani: Secondary | ICD-10-CM

## 2012-11-28 NOTE — Telephone Encounter (Signed)
Pharmacy is requesting RFs for pt's hydrocortisone 25 mg for Anusol 25 mg suppositories. Dr Conley Rolls do you want to RF and for how many months?

## 2012-11-29 MED ORDER — HYDROCORTISONE ACETATE 25 MG RE SUPP: 25.0000 mg | Freq: Two times a day (BID) | RECTAL | Status: DC

## 2012-12-30 ENCOUNTER — Ambulatory Visit (INDEPENDENT_AMBULATORY_CARE_PROVIDER_SITE_OTHER): Payer: BC Managed Care – PPO | Admitting: Emergency Medicine

## 2012-12-30 ENCOUNTER — Ambulatory Visit: Payer: BC Managed Care – PPO

## 2012-12-30 VITALS — BP 110/82 | HR 110 | Temp 99.1°F | Resp 16 | Ht 64.0 in | Wt 214.2 lb

## 2012-12-30 DIAGNOSIS — R05 Cough: Secondary | ICD-10-CM

## 2012-12-30 DIAGNOSIS — R059 Cough, unspecified: Secondary | ICD-10-CM

## 2012-12-30 DIAGNOSIS — J45909 Unspecified asthma, uncomplicated: Secondary | ICD-10-CM

## 2012-12-30 DIAGNOSIS — J3489 Other specified disorders of nose and nasal sinuses: Secondary | ICD-10-CM

## 2012-12-30 DIAGNOSIS — J209 Acute bronchitis, unspecified: Secondary | ICD-10-CM

## 2012-12-30 DIAGNOSIS — R0981 Nasal congestion: Secondary | ICD-10-CM

## 2012-12-30 LAB — POCT CBC
Granulocyte percent: 67.2 %G (ref 37–80)
HCT, POC: 45.8 % (ref 37.7–47.9)
Hemoglobin: 15 g/dL (ref 12.2–16.2)
Lymph, poc: 2.2 (ref 0.6–3.4)
MCH, POC: 29.3 pg (ref 27–31.2)
MCHC: 32.8 g/dL (ref 31.8–35.4)
MCV: 89.5 fL (ref 80–97)
MID (cbc): 0.7 (ref 0–0.9)
MPV: 8.1 fL (ref 0–99.8)
POC Granulocyte: 5.9 (ref 2–6.9)
POC LYMPH PERCENT: 25.2 %L (ref 10–50)
POC MID %: 7.6 %M (ref 0–12)
Platelet Count, POC: 371 10*3/uL (ref 142–424)
RBC: 5.12 M/uL (ref 4.04–5.48)
RDW, POC: 13.2 %
WBC: 8.8 10*3/uL (ref 4.6–10.2)

## 2012-12-30 MED ORDER — IPRATROPIUM BROMIDE 0.03 % NA SOLN: 2.0000 | Freq: Two times a day (BID) | NASAL | Status: DC

## 2012-12-30 MED ORDER — BENZONATATE 100 MG PO CAPS: 100.0000 mg | ORAL_CAPSULE | Freq: Three times a day (TID) | ORAL | Status: DC | PRN

## 2012-12-30 MED ORDER — AZITHROMYCIN 250 MG PO TABS: ORAL_TABLET | ORAL | Status: DC

## 2012-12-30 NOTE — Progress Notes (Signed)
Subjective:    Patient ID: Mckenzie Vasquez, female    DOB: 09/27/1950, 62 y.o.   MRN: [id number]  HPI 62 year old female presents with 1 week history of nasal congestion, cough, sore throat, wheezing, SOB, and PND.  Does have a history of allergic rhinitis and asthma treated with albuterol prn.  Has used Advair in the past but has not been using regularly.  Admits to chills and fever (max 100.0).  Has taken Nyquil which does seem to help with her cough and congestion at night.  Cough is dry and hacking, not productive of any sputum.      Review of Systems  Constitutional: Positive for fever and chills.  HENT: Positive for congestion, sore throat, rhinorrhea and postnasal drip. Negative for ear pain, neck stiffness and sinus pressure.   Respiratory: Positive for cough and wheezing. Negative for shortness of breath.   Cardiovascular: Negative for chest pain.  Gastrointestinal: Negative for nausea, vomiting and abdominal pain.  Neurological: Negative for dizziness and headaches.       Objective:   Physical Exam  Constitutional: She is oriented to person, place, and time. She appears well-developed and well-nourished.  HENT:  Head: Normocephalic and atraumatic.  Right Ear: Hearing, tympanic membrane, external ear and ear canal normal.  Left Ear: Hearing, tympanic membrane, external ear and ear canal normal.  Mouth/Throat: Uvula is midline, oropharynx is clear and moist and mucous membranes are normal. No oropharyngeal exudate.  Eyes: Conjunctivae are normal.  Neck: Normal range of motion. Neck supple.  Cardiovascular: Normal rate, regular rhythm and normal heart sounds.   Pulmonary/Chest: Effort normal and breath sounds normal.  Lymphadenopathy:    She has no cervical adenopathy.  Neurological: She is alert and oriented to person, place, and time.  Psychiatric: She has a normal mood and affect. Her behavior is normal. Judgment and thought content normal.     Results for orders placed  in visit on 12/30/12  POCT CBC      Result Value Range   WBC 8.8  4.6 - 10.2 K/uL   Lymph, poc 2.2  0.6 - 3.4   POC LYMPH PERCENT 25.2  10 - 50 %L   MID (cbc) 0.7  0 - 0.9   POC MID % 7.6  0 - 12 %M   POC Granulocyte 5.9  2 - 6.9   Granulocyte percent 67.2  37 - 80 %G   RBC 5.12  4.04 - 5.48 M/uL   Hemoglobin 15.0  12.2 - 16.2 g/dL   HCT, POC 14.7  82.9 - 47.9 %   MCV 89.5  80 - 97 fL   MCH, POC 29.3  27 - 31.2 pg   MCHC 32.8  31.8 - 35.4 g/dL   RDW, POC 56.2     Platelet Count, POC 371  142 - 424 K/uL   MPV 8.1  0 - 99.8 fL        [facility] reading (PRIMARY) by  Dr. Cleta Alberts as no acute infiltrate or consolidation.   Assessment & Plan:  Cough - Plan: POCT CBC, DG Chest 2 View, benzonatate (TESSALON) 100 MG capsule  Unspecified asthma - Plan: DG Chest 2 View  Nasal congestion - Plan: ipratropium (ATROVENT) 0.03 % nasal spray  Acute bronchitis - Plan: azithromycin (ZITHROMAX) 250 MG tablet  Zpack as directed Atrovent NS twice daily to help with congestion Albuterol q4-6hours as needed for wheezing and SOB.   Tessalon perles tid prn cough Continue Nyquil qhs as this is  working well Follow up if symptoms worsen or fail to improve.

## 2013-05-07 ENCOUNTER — Other Ambulatory Visit: Payer: Self-pay

## 2013-05-07 DIAGNOSIS — Z1231 Encounter for screening mammogram for malignant neoplasm of breast: Secondary | ICD-10-CM

## 2013-06-01 ENCOUNTER — Ambulatory Visit (INDEPENDENT_AMBULATORY_CARE_PROVIDER_SITE_OTHER): Payer: BC Managed Care – PPO | Admitting: Radiology

## 2013-06-01 DIAGNOSIS — Z23 Encounter for immunization: Secondary | ICD-10-CM

## 2013-06-07 ENCOUNTER — Ambulatory Visit
Admission: RE | Admit: 2013-06-07 | Discharge: 2013-06-07 | Disposition: A | Discharge status: Home / Self Care | Payer: BC Managed Care – PPO | Source: Ambulatory Visit

## 2013-06-07 DIAGNOSIS — Z1231 Encounter for screening mammogram for malignant neoplasm of breast: Secondary | ICD-10-CM

## 2013-10-06 ENCOUNTER — Other Ambulatory Visit: Payer: Self-pay | Admitting: Internal Medicine

## 2013-10-24 ENCOUNTER — Ambulatory Visit (INDEPENDENT_AMBULATORY_CARE_PROVIDER_SITE_OTHER): Payer: BC Managed Care – PPO | Admitting: Internal Medicine

## 2013-10-24 ENCOUNTER — Encounter: Payer: Self-pay | Admitting: Internal Medicine

## 2013-10-24 VITALS — BP 140/92 | HR 103 | Temp 98.3°F | Resp 16 | Ht 63.0 in | Wt 214.2 lb

## 2013-10-24 DIAGNOSIS — H16139 Photokeratitis, unspecified eye: Secondary | ICD-10-CM

## 2013-10-24 DIAGNOSIS — Z6838 Body mass index (BMI) 38.0-38.9, adult: Secondary | ICD-10-CM

## 2013-10-24 DIAGNOSIS — E669 Obesity, unspecified: Secondary | ICD-10-CM

## 2013-10-24 DIAGNOSIS — G4730 Sleep apnea, unspecified: Secondary | ICD-10-CM | POA: Insufficient documentation

## 2013-10-24 DIAGNOSIS — M2550 Pain in unspecified joint: Secondary | ICD-10-CM

## 2013-10-24 MED ORDER — MELOXICAM 15 MG PO TABS: 15.0000 mg | ORAL_TABLET | Freq: Every day | ORAL | Status: DC

## 2013-10-24 NOTE — Progress Notes (Signed)
Subjective:    Patient ID: Mckenzie SellaKathleen M Sturdy, female    DOB: 12-17-1950, 63 y.o.   MRN: [id number] This chart was scribed for Ellamae Siaobert Cathlin Buchan, MD by Valera CastleSteven Perry, ED Scribe. This patient was seen in room 24 and the patient's care was started at 4:40 PM.  Chief Complaint  Patient presents with  . Arthritis    feet, hand,wrist and stiffness all over   HPI Mckenzie Vasquez is a 63 y.o. female Pt presents with arthritis of her feet, hands, and wrist, with associated generalized stiffness.   She reports the Meloxicam has been successful for controlling her arthritis pain. She reports in the past after exercising she would not be able to walk for a week, but with the Meloxicam she can resume exercising. She states she has not taken Meloxicam since last week and is very stiff and sore in her joints.  She reports when she doesn't take her medication her wrists and feet hurt the worst. She denies redness and swelling over the areas. She reports the knuckles over her right hand are worse than the knuckles on her left hand. She reports that her right knuckle pain is constant, but her left knuckle pain is an intermittent shooting pain with certain movements. She denies weakness to her ankles, but reports stiffness. She states without her Meloxicam it is uncomfortable to lay flat on her back due to stiffness. She will sleep on her sides. She reports h/o using orthotic for her right heel. She denies having used orthotics for the balls of her feet.   She reports sleep disturbance, thinking she may have sleep apnea. She doesn't know if she would be able to tolerate a mask. She states her husband has not said anything about her stopping breathing at night. She states she does snore. She states she tends to be a night owl, and with having to work early in the morning, she does not get a good length of sleep.   She reports multiple itching, skin spots over her chest that she wants to have checked out. She reports h/o  sun bathing when she was younger. She states one of the spots has started scabbing a little.  PCP - Tonye PearsonOLITTLE, Lily Kernen P, MD  Patient Active Problem List   Diagnosis Date Noted  . BMI 38.0-38.9,adult 11/07/2012  . Intrinsic asthma 11/07/2012  . OBESITY 10/25/2007  . HYPERTENSION, BENIGN 10/25/2007  . GERD 10/25/2007  . HIATAL HERNIA 10/25/2007  . INTERSTITIAL CYSTITIS 10/25/2007  . ENDOMETRIOSIS 10/25/2007  . FIBROCYSTIC BREAST DISEASE, HX OF 10/25/2007   Prior to Admission medications   Medication Sig Start Date End Date Taking? Authorizing Provider  Albuterol Sulfate (PROAIR HFA IN) Inhale 1 Inhaler into the lungs as needed.    Historical Provider, MD  azithromycin (ZITHROMAX) 250 MG tablet Take 2 tabs PO x 1 dose, then 1 tab PO QD x 4 days 12/30/12   Nelva NayHeather M Marte, PA-C  benzonatate (TESSALON) 100 MG capsule Take 1-2 capsules (100-200 mg total) by mouth 3 (three) times daily as needed for cough. 12/30/12   Heather Jaquita RectorM Marte, PA-C  esomeprazole (NEXIUM) 40 MG capsule Take 40 mg by mouth daily before breakfast.    Historical Provider, MD  Estradiol (VAGIFEM VA) Place 1 application vaginally daily.    Historical Provider, MD  Fluticasone-Salmeterol (ADVAIR DISKUS) 250-50 MCG/DOSE AEPB Inhale 1 puff into the lungs 2 (two) times daily. 11/29/11   Rickard PatienceSarah E Nelson, PA-C  hydrocortisone (ANUSOL-HC) 25 MG suppository Place 1  suppository (25 mg total) rectally 2 (two) times daily. 11/29/12   Thao P Le, DO  HYDROCORTISONE ACE, RECTAL, 30 MG SUPP Place 1 suppository (30 mg total) rectally 2 (two) times daily. 05/05/12   Thao P Le, DO  ipratropium (ATROVENT) 0.03 % nasal spray Place 2 sprays into the nose 2 (two) times daily. 12/30/12   Heather Jaquita Rector, PA-C  lisinopril (PRINIVIL,ZESTRIL) 10 MG tablet Take 10 mg by mouth daily.    Historical Provider, MD  meloxicam (MOBIC) 15 MG tablet Take 1 tablet (15 mg total) by mouth daily. PATIENT NEEDS OFFICE VISIT FOR ADDITIONAL REFILLS 10/06/13   Tonye Pearson, MD  Mometasone Furo-Formoterol Fum 200-5 MCG/ACT AERO Inhale 1 puff into the lungs 2 (two) times daily. 11/30/11   Rickard Patience, PA-C  OPTIVAR 0.05 % ophthalmic solution INSTILL 1 DROP IN [facility] EYE TWICE DAILY. 10/25/11   Sondra Barges, PA-C   Review of Systems  Respiratory: Positive for cough.   Musculoskeletal: Positive for arthralgias (hands, wrists, feet). Negative for joint swelling.       Positive for stiffness over ankles, back  Skin:       Negative for redness  Psychiatric/Behavioral: Positive for sleep disturbance.      Objective:   Physical Exam Nursing note and vitals reviewed. Constitutional: Pt is oriented to person, place, and time. Pt appears well-developed and well-nourished. No distress.  HENT:  Head: Normocephalic and atraumatic.  Eyes: EOM are normal.  Neck: Neck supple.  Cardiovascular: Normal rate. Pulmonary/Chest: Effort normal. No respiratory distress.  Abdominal:  Musculoskeletal: Normal range of motion.  there is no swelling in the hands or feet. No redness. No joint abnormalities.   She is generally tender with palpation of the feet and with range of motion of the wrist s Neurological: Pt is alert and oriented to person, place, and time.  Skin: Skin is warm and dry. Actinic keratotic lesions over chest.  Psychiatric: Pt has a normal mood and affect. Pt's behavior is normal.   Pulse 103  Temp(Src) 98.3 F (36.8 C) (Oral)  Resp 16  Ht 5\' 3"  (1.6 m)  Wt 214 lb 3.2 oz (97.16 kg)  BMI 37.95 kg/m2  SpO2 97%     Assessment & Plan:  Pain, joint, multiple sites  Actinic keratitis - Plan: Ambulatory referral to Dermatology  Unspecified sleep apnea - Plan: Ambulatory referral to Sleep Studies  OBESITY  BMI 38.0-38.9,adult  Meds ordered this encounter  Medications  . meloxicam (MOBIC) 15 MG tablet    Sig: Take 1 tablet (15 mg total) by mouth daily.    Dispense:  90 tablet    Refill:  3   With her nonspecific joint findings, her pain  problems with activity, and her prior negative rheumatological workup at the last visit, her sleep issues could be the most important thing along with her obesity 2 resolving these joint issues.     I have completed the patient encounter in its entirety as documented by the scribe, with editing by me where necessary. [address] P. Merla Riches, M.D.

## 2013-11-06 ENCOUNTER — Other Ambulatory Visit: Payer: Self-pay | Admitting: Internal Medicine

## 2013-11-08 ENCOUNTER — Other Ambulatory Visit: Payer: Self-pay | Admitting: Internal Medicine

## 2013-11-08 NOTE — Telephone Encounter (Signed)
Dr Merla Richesoolittle, you just saw pt but not for BP. Do you want to RF?

## 2013-12-13 ENCOUNTER — Ambulatory Visit (INDEPENDENT_AMBULATORY_CARE_PROVIDER_SITE_OTHER): Payer: BC Managed Care – PPO | Admitting: Neurology

## 2013-12-13 ENCOUNTER — Encounter (INDEPENDENT_AMBULATORY_CARE_PROVIDER_SITE_OTHER): Payer: Self-pay

## 2013-12-13 ENCOUNTER — Encounter: Payer: Self-pay | Admitting: Neurology

## 2013-12-13 VITALS — BP 132/84 | HR 96 | Temp 97.9°F | Ht 63.0 in | Wt 220.0 lb

## 2013-12-13 DIAGNOSIS — R0609 Other forms of dyspnea: Secondary | ICD-10-CM

## 2013-12-13 DIAGNOSIS — R0683 Snoring: Secondary | ICD-10-CM

## 2013-12-13 DIAGNOSIS — G478 Other sleep disorders: Secondary | ICD-10-CM

## 2013-12-13 DIAGNOSIS — E669 Obesity, unspecified: Secondary | ICD-10-CM

## 2013-12-13 DIAGNOSIS — R400 Somnolence: Secondary | ICD-10-CM

## 2013-12-13 DIAGNOSIS — G4710 Hypersomnia, unspecified: Secondary | ICD-10-CM

## 2013-12-13 DIAGNOSIS — R0989 Other specified symptoms and signs involving the circulatory and respiratory systems: Secondary | ICD-10-CM

## 2013-12-13 NOTE — Patient Instructions (Signed)

## 2013-12-13 NOTE — Progress Notes (Signed)
Subjective:    Patient ID: Mckenzie Vasquez is a 63 y.o. female.  HPI    Huston Foley, MD, PhD [facility] [address] [address] [place], [place] [address]  Dear Dr. Merla Riches,   I saw your patient, Mckenzie Vasquez, upon your kind request, in my neurologic clinic today for initial consultation of Mckenzie Vasquez sleep disorder, concern for obstructive sleep apnea. The patient is unaccompanied today. 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  Mckenzie Vasquez typical bedtime is reported to be around 11 PM and usual wake time is around 5:30 AM. Sleep onset typically occurs within 20 minutes. She reports feeling poorly rested upon awakening. She wakes up on an average 1 times in the middle of the night and has to go to the bathroom 0 times on a typical night. She denies morning headaches.  She reports excessive daytime somnolence (EDS) and Mckenzie Vasquez Epworth Sleepiness Score (ESS) is 3/24 today. However, she reports drinking a lot of [facility] during the day, up to 5-6 cans. She has not fallen asleep while driving, but has come close to dozing off, but would always pull over if the were sleepy. The patient has not been taking a scheduled nap.  She has been known to snore for the past many years. Snoring is reportedly marked, and not clear if associated with choking sounds and witnessed apneas. The patient denies a sense of choking or strangling feeling. There is report of nighttime reflux, with occasional nighttime cough experienced. The patient has not noted any RLS symptoms and is not known to kick while asleep or before falling asleep. There is no family history of RLS or OSA.  She is a restless  sleeper and in the morning, the bed is quite disheveled.   She denies cataplexy, sleep paralysis, hypnagogic or hypnopompic hallucinations, or sleep attacks. She does not report any vivid dreams, nightmares, dream enactments, or parasomnias, such as sleep talking or sleep walking. The patient has not had a sleep study or a home sleep test.  Mckenzie Vasquez bedroom is usually dark and cool. There is a TV in the bedroom and usually it is on at night.   Mckenzie Vasquez Past Medical History Is Significant For: Past Medical History  Diagnosis Date  . Hypertension   . GERD (gastroesophageal reflux disease)   . Asthma   . Diverticulitis large intestine 2012    pt. states this is ongoing and sometimes worse than others    Mckenzie Vasquez Past Surgical History Is Significant For: Past Surgical History  Procedure Laterality Date  . Cholecystectomy    . Breast biopsy      left  . Tonsillectomy    . Exploratory laparotomy      x2; oophorectomyx1  . Laparotomy  1997  . Laparoscopic endometriosis fulguration  1997    x3    Mckenzie Vasquez Family History Is Significant For: Family History  Problem Relation Age of Onset  . Colon cancer Maternal Grandfather   . Colon cancer Other   . Colon cancer Paternal Grandfather   . Stroke Paternal Grandfather   . Alzheimer's disease Mother   . COPD Father   . Stroke Father   . Anxiety disorder Daughter     Mckenzie Vasquez Social History Is Significant For: History   Social History  .  Marital Status: Married    Spouse Name: N/A    Number of Children: N/A  . Years of Education: N/A   Social History Main Topics  . Smoking status: Never Smoker   . Smokeless tobacco: Never Used  . Alcohol Use: No  . Drug Use: No  . Sexual Activity: Yes   Other Topics Concern  . None   Social History Narrative  . None    Mckenzie Vasquez Allergies Are:  Allergies  Allergen Reactions  . Sulfa Antibiotics Rash  :   Mckenzie Vasquez Current Medications Are:  Outpatient Encounter Prescriptions as of 12/13/2013  Medication Sig  .  esomeprazole (NEXIUM) 40 MG capsule Take 40 mg by mouth daily before breakfast.  . lisinopril-hydrochlorothiazide (PRINZIDE,ZESTORETIC) 10-12.5 MG per tablet TAKE 1 TABLET ONCE DAILY.  . meloxicam (MOBIC) 15 MG tablet Take 1 tablet (15 mg total) by mouth daily.  . OPTIVAR 0.05 % ophthalmic solution INSTILL 1 DROP IN EACH EYE TWICE DAILY.  Marland Kitchen. Albuterol Sulfate (PROAIR HFA IN) Inhale 1 Inhaler into the lungs as needed.  . benzonatate (TESSALON) 100 MG capsule Take 1-2 capsules (100-200 mg total) by mouth 3 (three) times daily as needed for cough.  . Estradiol (VAGIFEM VA) Place 1 application vaginally daily.  . Fluticasone-Salmeterol (ADVAIR DISKUS) 250-50 MCG/DOSE AEPB Inhale 1 puff into the lungs 2 (two) times daily.  . hydrocortisone (ANUSOL-HC) 25 MG suppository Place 1 suppository (25 mg total) rectally 2 (two) times daily.  Marland Kitchen. HYDROCORTISONE ACE, RECTAL, 30 MG SUPP Place 1 suppository (30 mg total) rectally 2 (two) times daily.  Marland Kitchen. ipratropium (ATROVENT) 0.03 % nasal spray Place 2 sprays into the nose 2 (two) times daily.  . Mometasone Furo-Formoterol Fum 200-5 MCG/ACT AERO Inhale 1 puff into the lungs 2 (two) times daily.  . [DISCONTINUED] azithromycin (ZITHROMAX) 250 MG tablet Take 2 tabs PO x 1 dose, then 1 tab PO QD x 4 days  . [DISCONTINUED] lisinopril (PRINIVIL,ZESTRIL) 10 MG tablet Take 10 mg by mouth daily.  :  Review of Systems:  Out of a complete 14 point review of systems, all are reviewed and negative with the exception of these symptoms as listed below:   Review of Systems  Constitutional: Positive for fatigue.  HENT: Negative.   Eyes: Negative.   Respiratory: Positive for apnea (snoring), cough and wheezing.   Cardiovascular: Negative.   Gastrointestinal: Negative.   Endocrine: Negative.   Genitourinary: Negative.   Musculoskeletal: Positive for arthralgias and joint swelling.  Skin: Negative.   Allergic/Immunologic: Negative.   Neurological: Negative.   Hematological:  Negative.   Psychiatric/Behavioral: Positive for sleep disturbance (e.d.s., snoring).    Objective:  Neurologic Exam  Physical Exam Physical Examination:   Filed Vitals:   12/13/13 0901  BP: 132/84  Pulse: 96  Temp: 97.9 F (36.6 C)    General Examination: The patient is a very pleasant 63 y.o. female in no acute distress. She appears well-developed and well-nourished and well groomed. She is overweight.   HEENT: Normocephalic, atraumatic, pupils are equal, round and reactive to light and accommodation. Funduscopic exam is normal with sharp disc margins noted. Extraocular tracking is good without limitation to gaze excursion or nystagmus noted. Normal smooth pursuit is noted. Hearing is grossly intact. Tympanic membranes are clear bilaterally. Face is symmetric with normal facial animation and normal facial sensation. Speech is clear with no dysarthria noted. There is no hypophonia. There is no lip, neck/head, jaw or voice tremor. Neck is supple with full range of passive and  active motion. There are no carotid bruits on auscultation. Oropharynx exam reveals: mild mouth dryness, adequate dental hygiene and moderate airway crowding, due to narrow airway entry and slightly redundant soft palate. Mallampati is class II. Tongue protrudes centrally and palate elevates symmetrically. Tonsils are absent. Neck size is 15.25 inches.   Chest: Clear to auscultation without wheezing, rhonchi or crackles noted.  Heart: S1+S2+0, regular and normal without murmurs, rubs or gallops noted.   Abdomen: Soft, non-tender and non-distended with normal bowel sounds appreciated on auscultation.  Extremities: There is no pitting edema in the distal lower extremities bilaterally. Pedal pulses are intact.  Skin: Warm and dry without trophic changes noted. There are no varicose veins.  Musculoskeletal: exam reveals no obvious joint deformities, tenderness or joint swelling or erythema.   Neurologically:   Mental status: The patient is awake, alert and oriented in all 4 spheres. Mckenzie Vasquez immediate and remote memory, attention, language skills and fund of knowledge are appropriate. There is no evidence of aphasia, agnosia, apraxia or anomia. Speech is clear with normal prosody and enunciation. Thought process is linear. Mood is normal and affect is normal.  Cranial nerves II - XII are as described above under HEENT exam. In addition: shoulder shrug is normal with equal shoulder height noted. Motor exam: Normal bulk, strength and tone is noted. There is no drift, tremor or rebound. Romberg is negative. Reflexes are 2+ throughout. Babinski: Toes are flexor bilaterally. Fine motor skills and coordination: intact with normal finger taps, normal hand movements, normal rapid alternating patting, normal foot taps and normal foot agility.  Cerebellar testing: No dysmetria or intention tremor on finger to nose testing. Heel to shin is unremarkable bilaterally. There is no truncal or gait ataxia.  Sensory exam: intact to light touch, pinprick, vibration, temperature sense and proprioception in the upper and lower extremities.  Gait, station and balance: She stands easily. No veering to one side is noted. No leaning to one side is noted. Posture is age-appropriate and stance is narrow based. Gait shows normal stride length and normal pace. No problems turning are noted. She turns en bloc. Tandem walk is unremarkable. Intact toe and heel stance is noted.               Assessment and Plan:   In summary, ZAKYLA TONCHE is a very pleasant 63 y.o.-year old female with a history and physical exam concerning for obstructive sleep apnea (OSA).  I had a long chat with the patient about my findings and the diagnosis of OSA, its prognosis and treatment options. We talked about medical treatments, surgical interventions and non-pharmacological approaches. I explained in particular the risks and ramifications of untreated moderate to  severe OSA, especially with respect to developing cardiovascular disease down the Road, including congestive heart failure, difficult to treat hypertension, cardiac arrhythmias, or stroke. Even type 2 diabetes has, in part, been linked to untreated OSA. Symptoms of untreated OSA include daytime sleepiness, memory problems, mood irritability and mood disorder such as depression and anxiety, lack of energy, as well as recurrent headaches, especially morning headaches. We talked about cessation and trying to maintain a healthy lifestyle in general, as well as the importance of weight control. I encouraged the patient to eat healthy, exercise daily and keep well hydrated, to keep a scheduled bedtime and wake time routine, to not skip any meals and eat healthy snacks in between meals. I advised the patient not to drive when feeling sleepy. I recommended the following at this  time: sleep study with potential positive airway pressure titration.  I explained the sleep test procedure to the patient and also outlined possible surgical and non-surgical treatment options of OSA, including the use of a custom-made dental device (which would require a referral to a specialist dentist or oral surgeon), upper airway surgical options, such as pillar implants, radiofrequency surgery, tongue base surgery, and UPPP (which would involve a referral to an ENT surgeon). Rarely, jaw surgery such as mandibular advancement may be considered.  I also explained the CPAP treatment option to the patient, who indicated that she would be willing to try CPAP if the need arises. I explained the importance of being compliant with PAP treatment, not only for insurance purposes but primarily to improve Mckenzie Vasquez symptoms, and for the patient's long term health benefit, including to reduce Mckenzie Vasquez cardiovascular risks. I answered all Mckenzie Vasquez questions today and the patient was in agreement. I would like to see Mckenzie Vasquez back after the sleep study is completed and  encouraged Mckenzie Vasquez to call with any interim questions, concerns, problems or updates.   Thank you very much for allowing me to participate in the care of this nice patient. If I can be of any further assistance to you please do not hesitate to call me at [phone].  Sincerely,   Huston Foley, MD, PhD

## 2014-01-02 ENCOUNTER — Other Ambulatory Visit: Payer: Self-pay | Admitting: Family Medicine

## 2014-01-02 NOTE — Telephone Encounter (Signed)
Dr Merla Richesoolittle, you saw pt for check up in 10/2013, but this med hasn't been addressed since 2013 by Dr Conley RollsLe. Do you want to give pt RFs?

## 2014-01-07 ENCOUNTER — Telehealth: Payer: Self-pay | Admitting: Neurology

## 2014-01-07 NOTE — Telephone Encounter (Signed)
Pt currently has a deductible of $950 in which nothing has been applied.  Her coverage would be 80% but only after the deductible has been satisfied.  She would like to postpone the sleep study at this time due to financial reasons.  She will call and follow up with us at a later date when she is able to afford it.

## 2014-01-14 ENCOUNTER — Encounter: Payer: Self-pay | Admitting: Internal Medicine

## 2014-01-21 ENCOUNTER — Ambulatory Visit (INDEPENDENT_AMBULATORY_CARE_PROVIDER_SITE_OTHER): Payer: BC Managed Care – PPO | Admitting: Emergency Medicine

## 2014-01-21 VITALS — BP 130/74 | HR 102 | Temp 99.0°F | Resp 20 | Ht 62.0 in | Wt 218.8 lb

## 2014-01-21 DIAGNOSIS — J209 Acute bronchitis, unspecified: Secondary | ICD-10-CM

## 2014-01-21 MED ORDER — AZITHROMYCIN 250 MG PO TABS: ORAL_TABLET | ORAL | Status: DC

## 2014-01-21 MED ORDER — ALBUTEROL SULFATE HFA 108 (90 BASE) MCG/ACT IN AERS: 2.0000 | INHALATION_SPRAY | RESPIRATORY_TRACT | Status: AC | PRN

## 2014-01-21 MED ORDER — PROMETHAZINE-CODEINE 6.25-10 MG/5ML PO SYRP: 5.0000 mL | ORAL_SOLUTION | Freq: Four times a day (QID) | ORAL | Status: DC | PRN

## 2014-01-21 NOTE — Progress Notes (Signed)
[facility] [address], [place] [address] [phone]  Date:  01/21/2014   Name:  Mckenzie SellaKathleen M Vasquez   DOB:  1951-04-29   MRN:  [id number]  PCP:  Tonye PearsonOLITTLE, ROBERT P, MD    Chief Complaint: Fever, Fatigue and Cough   History of Present Illness:  Mckenzie SellaKathleen M Vasquez is a 63 y.o. very pleasant female patient who presents with the following:  Ill for a week with nasal congestion and post nasal drip.  Predominantly mucoid in character.  Over the weekend developed a non productive cough with a fever. No wheezing or shortness of breath.  History of asthma.  No shortness of breath.  Muscle aches and fatigue.  No nausea or vomiting. Not using MDI.  No improvement with over the counter medications or other home remedies. Denies other complaint or health concern today.   Patient Active Problem List   Diagnosis Date Noted  . Pain, joint, multiple sites 10/24/2013  . Unspecified sleep apnea 10/24/2013  . BMI 38.0-38.9,adult 11/07/2012  . Intrinsic asthma 11/07/2012  . OBESITY 10/25/2007  . HYPERTENSION, BENIGN 10/25/2007  . GERD 10/25/2007  . HIATAL HERNIA 10/25/2007  . INTERSTITIAL CYSTITIS 10/25/2007  . ENDOMETRIOSIS 10/25/2007  . FIBROCYSTIC BREAST DISEASE, HX OF 10/25/2007    Past Medical History  Diagnosis Date  . Hypertension   . GERD (gastroesophageal reflux disease)   . Asthma   . Diverticulitis large intestine 2012    pt. states this is ongoing and sometimes worse than others    Past Surgical History  Procedure Laterality Date  . Cholecystectomy    . Breast biopsy      left  . Tonsillectomy    . Exploratory laparotomy      x2; oophorectomyx1  . Laparotomy  1997  . Laparoscopic endometriosis fulguration  1997    x3    History  Substance Use Topics  . Smoking status: Never Smoker   . Smokeless tobacco: Never Used  . Alcohol Use: No    Family History  Problem Relation Age of Onset  . Colon cancer Maternal Grandfather   . Colon cancer Other    . Colon cancer Paternal Grandfather   . Stroke Paternal Grandfather   . Alzheimer's disease Mother   . COPD Father   . Stroke Father   . Anxiety disorder Daughter     Allergies  Allergen Reactions  . Sulfa Antibiotics Rash    Medication list has been reviewed and updated.  Current Outpatient Prescriptions on File Prior to Visit  Medication Sig Dispense Refill  . Albuterol Sulfate (PROAIR HFA IN) Inhale 1 Inhaler into the lungs as needed.      Marland Kitchen. esomeprazole (NEXIUM) 40 MG capsule Take 40 mg by mouth daily before breakfast.      . Estradiol (VAGIFEM VA) Place 1 application vaginally daily.      . Fluticasone-Salmeterol (ADVAIR DISKUS) 250-50 MCG/DOSE AEPB Inhale 1 puff into the lungs 2 (two) times daily.  60 each  0  . hydrocortisone (ANUSOL-HC) 25 MG suppository Place 1 suppository (25 mg total) rectally 2 (two) times daily.  28 suppository  11  . HYDROCORTISONE ACE, RECTAL, 30 MG SUPP Place 1 suppository (30 mg total) rectally 2 (two) times daily.  28 each  2  . lisinopril-hydrochlorothiazide (PRINZIDE,ZESTORETIC) 10-12.5 MG per tablet TAKE 1 TABLET ONCE DAILY.  90 tablet  3  . meloxicam (MOBIC) 15 MG tablet Take 1 tablet (15 mg total) by mouth daily.  90 tablet  3  . OPTIVAR 0.05 % ophthalmic solution INSTILL 1 DROP IN EACH EYE TWICE DAILY.  6 mL  0  . PROCTOZONE-HC 2.5 % rectal cream APPLY RECTALLY TWICE DAILY AS DIRECTED.  30 g  3   No current facility-administered medications on file prior to visit.    Review of Systems:  As per HPI, otherwise negative.    Physical Examination: Filed Vitals:   01/21/14 1518  BP: 130/74  Pulse: 102  Temp: 99 F (37.2 C)  Resp: 20   Filed Vitals:   01/21/14 1518  Height: 5\' 2"  (1.575 m)  Weight: 218 lb 12.8 oz (99.247 kg)   Body mass index is 40.01 kg/(m^2). Ideal Body Weight: Weight in (lb) to have BMI = 25: 136.4  GEN: WDWN, NAD, Non-toxic, A & O x 3 HEENT: Atraumatic, Normocephalic. Neck supple. No masses, No LAD. Ears  and Nose: No external deformity. CV: RRR, No M/G/R. No JVD. No thrill. No extra heart sounds. PULM: CTA B, no wheezes, crackles, rhonchi. No retractions. No resp. distress. No accessory muscle use. ABD: S, NT, ND, +BS. No rebound. No HSM. EXTR: No c/c/e NEURO Normal gait.  PSYCH: Normally interactive. Conversant. Not depressed or anxious appearing.  Calm demeanor.    Assessment and Plan: Bronchitis MDI Phen c cod zpak  Signed,  Phillips OdorJeffery Abrham Maslowski, MD

## 2014-01-21 NOTE — Patient Instructions (Signed)

## 2014-03-19 ENCOUNTER — Ambulatory Visit: Payer: BC Managed Care – PPO | Admitting: Internal Medicine

## 2014-05-21 ENCOUNTER — Other Ambulatory Visit: Payer: Self-pay

## 2014-05-21 DIAGNOSIS — Z1231 Encounter for screening mammogram for malignant neoplasm of breast: Secondary | ICD-10-CM

## 2014-06-15 ENCOUNTER — Ambulatory Visit (INDEPENDENT_AMBULATORY_CARE_PROVIDER_SITE_OTHER): Payer: BC Managed Care – PPO | Admitting: *Deleted

## 2014-06-15 DIAGNOSIS — Z23 Encounter for immunization: Secondary | ICD-10-CM

## 2014-06-18 ENCOUNTER — Ambulatory Visit: Payer: BC Managed Care – PPO

## 2014-06-21 ENCOUNTER — Ambulatory Visit
Admission: RE | Admit: 2014-06-21 | Discharge: 2014-06-21 | Disposition: A | Discharge status: Home / Self Care | Payer: BC Managed Care – PPO | Source: Ambulatory Visit

## 2014-06-21 DIAGNOSIS — Z1231 Encounter for screening mammogram for malignant neoplasm of breast: Secondary | ICD-10-CM

## 2014-07-05 ENCOUNTER — Other Ambulatory Visit: Payer: Self-pay | Admitting: Family Medicine

## 2014-08-05 ENCOUNTER — Ambulatory Visit (INDEPENDENT_AMBULATORY_CARE_PROVIDER_SITE_OTHER): Payer: BC Managed Care – PPO

## 2014-08-05 ENCOUNTER — Ambulatory Visit (INDEPENDENT_AMBULATORY_CARE_PROVIDER_SITE_OTHER): Payer: BC Managed Care – PPO | Admitting: Internal Medicine

## 2014-08-05 VITALS — BP 150/90 | HR 85 | Temp 98.0°F | Resp 16 | Ht 62.5 in | Wt 194.0 lb

## 2014-08-05 DIAGNOSIS — G4730 Sleep apnea, unspecified: Secondary | ICD-10-CM

## 2014-08-05 DIAGNOSIS — M545 Low back pain: Secondary | ICD-10-CM

## 2014-08-05 DIAGNOSIS — M2550 Pain in unspecified joint: Secondary | ICD-10-CM

## 2014-08-05 DIAGNOSIS — Z23 Encounter for immunization: Secondary | ICD-10-CM

## 2014-08-05 DIAGNOSIS — I10 Essential (primary) hypertension: Secondary | ICD-10-CM

## 2014-08-05 MED ORDER — TYPHOID VACCINE PO CPDR: 1.0000 | DELAYED_RELEASE_CAPSULE | ORAL | Status: DC

## 2014-08-05 NOTE — Patient Instructions (Signed)
Tdap Vaccine (Tetanus, Diphtheria, Pertussis): What You Need to Know 1. Why get vaccinated? Tetanus, diphtheria and pertussis can be very serious diseases, even for adolescents and adults. Tdap vaccine can protect us from these diseases. TETANUS (Lockjaw) causes painful muscle tightening and stiffness, usually all over the body.  It can lead to tightening of muscles in the head and neck so you can't open your mouth, swallow, or sometimes even breathe. Tetanus kills about 1 out of 5 people who are infected. DIPHTHERIA can cause a thick coating to form in the back of the throat.  It can lead to breathing problems, paralysis, heart failure, and death. PERTUSSIS (Whooping Cough) causes severe coughing spells, which can cause difficulty breathing, vomiting and disturbed sleep.  It can also lead to weight loss, incontinence, and rib fractures. Up to 2 in 100 adolescents and 5 in 100 adults with pertussis are hospitalized or have complications, which could include pneumonia or death. These diseases are caused by bacteria. Diphtheria and pertussis are spread from person to person through coughing or sneezing. Tetanus enters the body through cuts, scratches, or wounds. Before vaccines, the [place] saw as many as 200,000 cases a year of diphtheria and pertussis, and hundreds of cases of tetanus. Since vaccination began, tetanus and diphtheria have dropped by about 99% and pertussis by about 80%. 2. Tdap vaccine Tdap vaccine can protect adolescents and adults from tetanus, diphtheria, and pertussis. One dose of Tdap is routinely given at age 11 or 12. People who did not get Tdap at that age should get it as soon as possible. Tdap is especially important for health care professionals and anyone having close contact with a baby younger than 12 months. Pregnant women should get a dose of Tdap during every pregnancy, to protect the newborn from pertussis. Infants are most at risk for severe, life-threatening  complications from pertussis. A similar vaccine, called Td, protects from tetanus and diphtheria, but not pertussis. A Td booster should be given every 10 years. Tdap may be given as one of these boosters if you have not already gotten a dose. Tdap may also be given after a severe cut or burn to prevent tetanus infection. Your doctor can give you more information. Tdap may safely be given at the same time as other vaccines. 3. Some people should not get this vaccine  If you ever had a life-threatening allergic reaction after a dose of any tetanus, diphtheria, or pertussis containing vaccine, OR if you have a severe allergy to any part of this vaccine, you should not get Tdap. Tell your doctor if you have any severe allergies.  If you had a coma, or long or multiple seizures within 7 days after a childhood dose of DTP or DTaP, you should not get Tdap, unless a cause other than the vaccine was found. You can still get Td.  Talk to your doctor if you:  have epilepsy or another nervous system problem,  had severe pain or swelling after any vaccine containing diphtheria, tetanus or pertussis,  ever had Guillain-Barr Syndrome (GBS),  aren't feeling well on the day the shot is scheduled. 4. Risks of a vaccine reaction With any medicine, including vaccines, there is a chance of side effects. These are usually mild and go away on their own, but serious reactions are also possible. Brief fainting spells can follow a vaccination, leading to injuries from falling. Sitting or lying down for about 15 minutes can help prevent these. Tell your doctor if you feel   dizzy or light-headed, or have vision changes or ringing in the ears. Mild problems following Tdap (Did not interfere with activities)  Pain where the shot was given (about 3 in 4 adolescents or 2 in 3 adults)  Redness or swelling where the shot was given (about 1 person in 5)  Mild fever of at least 100.38F (up to about 1 in 25 adolescents or  1 in 100 adults)  Headache (about 3 or 4 people in 10)  Tiredness (about 1 person in 3 or 4)  Nausea, vomiting, diarrhea, stomach ache (up to 1 in 4 adolescents or 1 in 10 adults)  Chills, body aches, sore joints, rash, swollen glands (uncommon) Moderate problems following Tdap (Interfered with activities, but did not require medical attention)  Pain where the shot was given (about 1 in 5 adolescents or 1 in 100 adults)  Redness or swelling where the shot was given (up to about 1 in 16 adolescents or 1 in 25 adults)  Fever over 102F (about 1 in 100 adolescents or 1 in 250 adults)  Headache (about 3 in 20 adolescents or 1 in 10 adults)  Nausea, vomiting, diarrhea, stomach ache (up to 1 or 3 people in 100)  Swelling of the entire arm where the shot was given (up to about 3 in 100). Severe problems following Tdap (Unable to perform usual activities; required medical attention)  Swelling, severe pain, bleeding and redness in the arm where the shot was given (rare). A severe allergic reaction could occur after any vaccine (estimated less than 1 in a million doses). 5. What if there is a serious reaction? What should I look for?  Look for anything that concerns you, such as signs of a severe allergic reaction, very high fever, or behavior changes. Signs of a severe allergic reaction can include hives, swelling of the face and throat, difficulty breathing, a fast heartbeat, dizziness, and weakness. These would start a few minutes to a few hours after the vaccination. What should I do?  If you think it is a severe allergic reaction or other emergency that can't wait, call 9-1-1 or get the person to the nearest hospital. Otherwise, call your doctor.  Afterward, the reaction should be reported to the "Vaccine Adverse Event Reporting System" (VAERS). Your doctor might file this report, or you can do it yourself through the VAERS web site at www.vaers.LAgents.nohhs.gov, or by calling  1-731-273-8033. VAERS is only for reporting reactions. They do not give medical advice.  6. The National Vaccine Injury Compensation Program The [employer] Vaccine Injury Compensation Program (VICP) is a federal program that was created to compensate people who may have been injured by certain vaccines. Persons who believe they may have been injured by a vaccine can learn about the program and about filing a claim by calling 1-737 128 6478 or visiting the VICP website at SpiritualWord.atwww.hrsa.gov/vaccinecompensation. 7. How can I learn more?  Ask your doctor.  Call your local or state health department.  Contact the Centers for Disease Control and Prevention (CDC):  Call 319-623-84021-937-484-8734 or visit CDC's website at PicCapture.uywww.cdc.gov/vaccines. CDC Tdap Vaccine VIS (01/20/12) Document Released: 02/29/2012 Document Revised: 01/14/2014 Document Reviewed: 12/12/2013 ExitCare Patient Information 2015 [place] [employer]. This information is not intended to replace advice given to you by your health care provider. Make sure you discuss any questions you have with your health care provider. Hepatitis A Vaccine: What You Need to Know 1. What is hepatitis A? Hepatitis A is a serious liver disease caused by the hepatitis A  virus (HAV). HAV is found in the stool of people with hepatitis A. It is usually spread by close personal contact and sometimes by eating food or drinking water containing HAV. A person who has hepatitis A can easily pass the disease to others within the same household. Hepatitis A can cause:  "flu-like" illness  jaundice (yellow skin or eyes, dark urine)  severe stomach pains and diarrhea (children) People with hepatitis A often have to be hospitalized (up to about 1 person in 5). Adults with hepatitis A are often too ill to work for up to a month. Sometimes, people die as a result of hepatitis A (about 3-6 deaths per 1,000 cases). Hepatitis A vaccine can prevent hepatitis A. 2. Who should get hepatitis A  vaccine and when? WHO Some people should be routinely vaccinated with hepatitis A vaccine:  All children between their first and second birthdays (2912 through 5123 months of age).  Anyone 1 year of age and older traveling to or working in countries with high or intermediate prevalence of hepatitis A, such as those located in [place] or [place], [place], [place]except [place], and [place]. For more information see http://www.church.org/www.cdc.gov/travel.  Children and adolescents 2 through 61 years of age who live in states or communities where routine vaccination has been implemented because of high disease incidence.  Men who have sex with men.  People who use street drugs.  People with chronic liver disease.  People who are treated with clotting factor concentrates.  People who work with HAV-infected primates or who work with HAV in Music therapistresearch laboratories.  Members of households planning to adopt a child, or care for a newly arriving adopted child, from a country where hepatitis A is common. Other people might get hepatitis A vaccine in certain situations (ask your doctor for more details):  Unvaccinated children or adolescents in communities where outbreaks of hepatitis A are occurring.  Unvaccinated people who have been exposed to hepatitis A virus.  Anyone 1 year of age or older who wants protection from hepatitis A. Hepatitis A vaccine is not licensed for children younger than 1 year of age. WHEN For children, the first dose should be given at 7512 through 7823 months of age. Children who are not vaccinated by 292 years of age can be vaccinated at later visits. For others at risk, the hepatitis A vaccine series may be started whenever a person wishes to be protected or is at risk of infection. For travelers, it is best to start the vaccine series at least 1 month before traveling. (Some protection may still result if the vaccine is given on or closer to the travel date.) Some people who  cannot get the vaccine before traveling, or for whom the vaccine might not be effective, can get a shot called immune globulin (IG). IG gives immediate, temporary protection. Two doses of the vaccine are needed for lasting protection. These doses should be given at least 6 months apart. Hepatitis A vaccine may be given at the same time as other vaccines. 3. Some people should not get hepatitis A vaccine or should wait.  Anyone who has ever had a severe (life threatening) allergic reaction to a previous dose of hepatitis A vaccine should not get another dose.  Anyone who has a severe (life threatening) allergy to any vaccine component should not get the vaccine.  Tell your doctor if you have any severe allergies, including a severe allergy to latex. All hepatitis A vaccines contain alum, and  some hepatitis A vaccines contain 2-phenoxyethanol.  Anyone who is moderately or severely ill at the time the shot is scheduled should probably wait until they recover. Ask your doctor. People with a mild illness can usually get the vaccine.  Tell your doctor if you are pregnant. Because hepatitis A vaccine is inactivated (killed), the risk to a pregnant woman or her unborn baby is believed to be very low. But your doctor can weigh any theoretical risk from the vaccine against the need for protection. 4. What are the risks from hepatitis A vaccine? A vaccine, like any medicine, could possibly cause serious problems, such as severe allergic reactions. The risk of hepatitis A vaccine causing serious harm, or death, is extremely small. Getting hepatitis A vaccine is much safer than getting the disease. Mild problems  soreness where the shot was given (about 1 out of 2 adults, and up to 1 out of 6 children)  headache (about 1 out of 6 adults and 1 out of 25 children)  loss of appetite (about 1 out of 12 children)  tiredness (about 1 out of 14 adults) If these problems occur, they usually last 1 or 2  days. Severe problems  serious allergic reaction, within a few minutes to a few hours after the shot (very rare). 5. What if there is a serious reaction? What should I look for?  Look for anything that concerns you, such as signs of a severe allergic reaction, very high fever, or behavior changes. Signs of a severe allergic reaction can include hives, swelling of the face and throat, difficulty breathing, a fast heartbeat, dizziness, and weakness. These would start a few minutes to a few hours after the vaccination. What should I do?  If you think it is a severe allergic reaction or other emergency that can't wait, call 9-1-1 or get the person to the nearest hospital. Otherwise, call your doctor.  Afterward, the reaction should be reported to the Vaccine Adverse Event Reporting System (VAERS). Your doctor might file this report, or you can do it yourself through the VAERS web site at www.vaers.LAgents.no, or by calling 1-(952) 389-7813. VAERS is only for reporting reactions. They do not give medical advice. 6. The National Vaccine Injury Compensation Program The [employer] Vaccine Injury Compensation Program (VICP) is a federal program that was created to compensate people who may have been injured by certain vaccines. Persons who believe they may have been injured by a vaccine can learn about the program and about filing a claim by calling 1-934-363-4083 or visiting the VICP website at SpiritualWord.at. 7. How can I learn more?  Ask your doctor.  Call your local or state health department.  Contact the Centers for Disease Control and Prevention (CDC):  Call 249-567-9101 (1-800-CDC-INFO) or  Visit CDC's website at: PicCapture.uy CDC Hepatitis A Vaccine VIS (Interim) (07/07/10)  Document Released: 06/24/2006 Document Revised: 01/14/2014 Document Reviewed: 10/11/2013 ExitCare Patient Information 2015 [place], [place]. This information is not intended to replace advice  given to you by your health care provider. Make sure you discuss any questions you have with your health care provider.

## 2014-08-05 NOTE — Progress Notes (Addendum)
Subjective:  This chart was scribed for [employer]. Merla Richesoolittle, MD by Marica OtterNusrat Rahman, ED Scribe. This patient was seen in room 11 and the patient's care was started at 8:14 PM.     Patient ID: Mckenzie Vasquez, female    DOB: 10/01/1950, 63 y.o.   MRN: [id number]  Chief Complaint  Patient presents with  . Immunizations    HPI PCP: Tonye PearsonOLITTLE, ROBERT P, MD  HPI Comments: Mckenzie Vasquez is a 63 y.o. female, with medical Hx noted below, who presents to the [facility] for several different reasons.   Medical Clearance. Pt requests a medical clearance to travel to [place] in January. Pt cannot recall when she had her last tetanus shot.   Flu Shot: Pt also requests a note regarding flu shot given in October to meet her employer's credentialing  Back Pain: Pt reports she recently had severe, atraumatic, sudden onset, back pain that caused problems with ambulation onset 2 weeks ago. While her Sx have improved, she came for evaluation because this has been occurring on and off for the last year. The pain is midline and to the left in the lumbosacral area without radicular symptoms in the lower extremities. The pain gets worse  if she sleeps too long, stands for an extended period of time. She has no problem with forward bending. Pt denies increased fatigue or urinary Sx.  No unexpected weight loss--she has done a very good job of losing weight through diet and exercise. No fever or night sweats. No numbness or weakness. She did experience difficulty ambulating on her left leg because of amount of pain in it created in the lumbar area to bear weight on the left.  Past Medical History  Diagnosis Date  . Hypertension   . GERD (gastroesophageal reflux disease)   . Asthma   . Diverticulitis large intestine 2012    pt. states this is ongoing and sometimes worse than others   Past Surgical History  Procedure Laterality Date  . Cholecystectomy    . Breast biopsy      left  .  Tonsillectomy    . Exploratory laparotomy      x2; oophorectomyx1  . Laparotomy  1997  . Laparoscopic endometriosis fulguration  1997    x3  . Colonoscopy    . Esophagogastroduodenoscopy      Current Outpatient Prescriptions on File Prior to Visit  Medication Sig Dispense Refill  . esomeprazole (NEXIUM) 40 MG capsule Take 40 mg by mouth daily before breakfast.    . Estradiol (VAGIFEM VA) Place 1 application vaginally daily.    . hydrocortisone (ANUCORT-HC) 25 MG suppository Place 1 suppository (25 mg total) rectally 2 (two) times daily. PATIENT NEEDS OFFICE VISIT FOR ADDITIONAL REFILLS 28 suppository 0  . HYDROCORTISONE ACE, RECTAL, 30 MG SUPP Place 1 suppository (30 mg total) rectally 2 (two) times daily. 28 each 2  . lisinopril-hydrochlorothiazide (PRINZIDE,ZESTORETIC) 10-12.5 MG per tablet TAKE 1 TABLET ONCE DAILY. 90 tablet 3  . meloxicam (MOBIC) 15 MG tablet Take 1 tablet (15 mg total) by mouth daily. 90 tablet 3  . OPTIVAR 0.05 % ophthalmic solution INSTILL 1 DROP IN EACH EYE TWICE DAILY. 6 mL 0  . PROCTOZONE-HC 2.5 % rectal cream APPLY RECTALLY TWICE DAILY AS DIRECTED. 30 g 3  . albuterol (PROAIR HFA) 108 (90 BASE) MCG/ACT inhaler Inhale 2 puffs into the lungs every 4 (four) hours as needed. (Patient not taking: Reported on 08/05/2014) 1 Inhaler 12  .  Fluticasone-Salmeterol (ADVAIR DISKUS) 250-50 MCG/DOSE AEPB Inhale 1 puff into the lungs 2 (two) times daily. (Patient not taking: Reported on 08/05/2014) 60 each 0   No current facility-administered medications on file prior to visit.   She is a new grandmother!!!!  Review of Systems  Constitutional: Negative for fever, chills and fatigue.       She continues to have large consumption of diet [facility] daily  Genitourinary: Negative for dysuria, urgency, frequency and difficulty urinating.  Musculoskeletal: Positive for back pain.       Since February she has been on meloxicam daily for her multiple arthralgias. She had normal  rheumatological labs. If she discontinues meloxicam she has pain in her feet and ankles without redness or swelling but with trouble with gait.   Neurological:       Note neurological exam supporting the diagnosis of obstructive sleep apnea. Sleep study has been delayed for economic reasons but will be done sometime in 2016.  Psychiatric/Behavioral: Negative for confusion.       Objective:   Physical Exam  Constitutional: She is oriented to person, place, and time. She appears well-developed and well-nourished. No distress.  Overweight but continuing to lose  HENT:  Head: Normocephalic and atraumatic.  Eyes: Conjunctivae and EOM are normal. Pupils are equal, round, and reactive to light.  Neck: Neck supple. No thyromegaly present.  Cardiovascular: Normal rate.   Pulmonary/Chest: Effort normal.  Musculoskeletal:  She is tender to palpation in the midline and to the left in the lumbosacral area including lower lumbar and iliac crest. Sciatic notch nontender. Straight leg raise to 90 bilaterally without pain. Forward flexion good. Tilting and rotation cause a minimal amount of pain. No muscle atrophy No distal sensory losses Deep tendon reflexes are symmetrical and 1+  Neurological: She is alert and oriented to person, place, and time. She has normal reflexes. No cranial nerve deficit.  Skin: Skin is warm and dry.  Psychiatric: She has a normal mood and affect. Her behavior is normal. Thought content normal.  Nursing note and vitals reviewed.  BP 150/90 mmHg  Pulse 85  Temp(Src) 98 F (36.7 C) (Oral)  Resp 16  Ht 5' 2.5" (1.588 m)  Wt 194 lb (87.998 kg)  BMI 34.90 kg/m2  SpO2 98% Wt Readings from Last 3 Encounters:  08/05/14 194 lb (87.998 kg)  01/21/14 218 lb 12.8 oz (99.247 kg)  12/13/13 220 lb (99.791 kg)    [facility] reading (PRIMARY) by  Dr.Doolittle=L3-4 narrow. Bony growth adjacent to L4-5 on Left.  Radiologist confirms degenerative changes without disc space  abnormalities or bony lesions that are lytic      Assessment & Plan:  Problem #1 lumbosacral pain secondary to degenerative disease without disc changes  We'll refer to physical therapy in hopes that all range long-term PT plan can enable her to become pain-free  Flexeril 10 mg at bedtime if needed  Continue meloxicam especially in view of other joint symptoms  Continue weight loss  Discontinue diet [place] [employer] to see if preservatives /artificial sweeteners could be having an effect on her joints  Problem #2 need for hepatitis A vaccine, typhoid vaccine, and updated Tdap  Problem #3 hypertension, on medications but not completely controlled today-follow with home blood pressures  Problem #4 overweight-continue weight loss plan  Problem #5 probable obstructive sleep apnea- follow-up for polysomnogram-  Meds ordered this encounter  Medications  . typhoid (VIVOTIF BERNA VACCINE) DR capsule    Sig: Take 1 capsule by mouth  every other day. Take with lukewarm water on empty stomach    Dispense:  4 capsule    Refill:  0  . cyclobenzaprine (FLEXERIL) 10 MG tablet    Sig: Take 1 tablet (10 mg total) by mouth at bedtime.    Dispense:  30 tablet    Refill:  3    I personally performed the services described in this documentation, which was scribed in my presence. The recorded information has been reviewed and is accurate.

## 2014-08-06 MED ORDER — CYCLOBENZAPRINE HCL 10 MG PO TABS: 10.0000 mg | ORAL_TABLET | Freq: Every day | ORAL | Status: DC

## 2014-08-08 ENCOUNTER — Encounter: Payer: Self-pay | Admitting: Internal Medicine

## 2014-09-10 ENCOUNTER — Other Ambulatory Visit: Payer: Self-pay

## 2014-09-10 NOTE — Telephone Encounter (Signed)
Pharm reqs RF of Anucort HC 25 mg suppository. Dr Merla Richesoolittle you just saw pt but don't see this med discussed recently. Do you want to give RFs? Pended, but when I type in Anucort, EPIC pulls up Anusol. Wanted to make you aware in case there is any difference.

## 2014-09-12 ENCOUNTER — Other Ambulatory Visit: Payer: Self-pay | Admitting: Internal Medicine

## 2014-09-12 MED ORDER — HYDROCORTISONE ACETATE 25 MG RE SUPP: 25.0000 mg | Freq: Two times a day (BID) | RECTAL | Status: DC

## 2014-11-08 ENCOUNTER — Other Ambulatory Visit: Payer: Self-pay | Admitting: Internal Medicine

## 2014-12-31 ENCOUNTER — Other Ambulatory Visit (HOSPITAL_COMMUNITY): Payer: Self-pay | Admitting: Obstetrics and Gynecology

## 2014-12-31 DIAGNOSIS — N951 Menopausal and female climacteric states: Secondary | ICD-10-CM

## 2015-02-01 ENCOUNTER — Other Ambulatory Visit: Payer: Self-pay | Admitting: Internal Medicine

## 2015-02-01 DIAGNOSIS — M545 Low back pain: Secondary | ICD-10-CM

## 2015-02-03 NOTE — Telephone Encounter (Signed)
Please advise on Rx refill

## 2015-02-03 NOTE — Telephone Encounter (Signed)
I will do the refill, but ask if she has been attending physical therapy as she was referred by Dr. Merla Richesoolittle in late November.  Please ask if her back pain has improved.  She should return for reevaluation as further imaging may need to be pursued if this has not gotten better, and/or possible referral to ortho.

## 2015-02-21 ENCOUNTER — Telehealth: Payer: Self-pay

## 2015-02-21 DIAGNOSIS — G478 Other sleep disorders: Secondary | ICD-10-CM

## 2015-02-21 NOTE — Telephone Encounter (Signed)
Patient requesting a referral for a sleep study. Per patient Dr Merla Riches order it for her last year but she never followed through. Patients call back number is [phone]

## 2015-02-21 NOTE — Telephone Encounter (Signed)
Ok for referral? What diagnosis to use?

## 2015-02-21 NOTE — Telephone Encounter (Signed)
Gave pt info

## 2015-02-24 ENCOUNTER — Other Ambulatory Visit: Payer: Self-pay

## 2015-02-24 ENCOUNTER — Other Ambulatory Visit: Payer: Self-pay | Admitting: Obstetrics and Gynecology

## 2015-02-24 DIAGNOSIS — Z780 Asymptomatic menopausal state: Secondary | ICD-10-CM

## 2015-03-03 ENCOUNTER — Telehealth: Payer: Self-pay | Admitting: Neurology

## 2015-03-03 NOTE — Telephone Encounter (Signed)
Pt called office and states she last saw Dr Frances Furbish last year and sleep study was ordered. At the time study was requested she had a variety of things take place and was unable to proceed with sleep study. Pt wants to know if a new order can be placed to have sleep study performed or does she need to return for consult and updated visit to proceed? Please advise.

## 2015-03-04 NOTE — Telephone Encounter (Signed)
Please schedule FU appt first, 15 min ok. It's been well over a year since I saw her.

## 2015-03-04 NOTE — Telephone Encounter (Signed)
Left message stating for her to call back to make f/u appt. She can be seen in a or slot.

## 2015-03-06 NOTE — Telephone Encounter (Signed)
Patient has appt

## 2015-03-26 ENCOUNTER — Encounter: Payer: Self-pay | Admitting: Gastroenterology

## 2015-03-28 ENCOUNTER — Other Ambulatory Visit: Payer: Self-pay

## 2015-03-28 DIAGNOSIS — M545 Low back pain: Secondary | ICD-10-CM

## 2015-03-28 MED ORDER — MELOXICAM 15 MG PO TABS: ORAL_TABLET | ORAL | Status: DC

## 2015-04-11 ENCOUNTER — Ambulatory Visit
Admission: RE | Admit: 2015-04-11 | Discharge: 2015-04-11 | Disposition: A | Discharge status: Home / Self Care | Payer: BLUE CROSS/BLUE SHIELD | Source: Ambulatory Visit | Attending: Obstetrics and Gynecology | Admitting: Obstetrics and Gynecology

## 2015-04-11 DIAGNOSIS — Z780 Asymptomatic menopausal state: Secondary | ICD-10-CM

## 2015-04-28 ENCOUNTER — Other Ambulatory Visit: Payer: Self-pay | Admitting: Internal Medicine

## 2015-05-11 ENCOUNTER — Ambulatory Visit (INDEPENDENT_AMBULATORY_CARE_PROVIDER_SITE_OTHER): Payer: BLUE CROSS/BLUE SHIELD | Admitting: Internal Medicine

## 2015-05-11 VITALS — BP 132/80 | HR 78 | Temp 98.6°F | Resp 18 | Ht 64.0 in | Wt 194.0 lb

## 2015-05-11 DIAGNOSIS — M545 Low back pain: Secondary | ICD-10-CM | POA: Diagnosis not present

## 2015-05-11 DIAGNOSIS — R399 Unspecified symptoms and signs involving the genitourinary system: Secondary | ICD-10-CM

## 2015-05-11 LAB — POCT UA - MICROSCOPIC ONLY
BACTERIA, U MICROSCOPIC: NEGATIVE
Casts, Ur, LPF, POC: NEGATIVE
Crystals, Ur, HPF, POC: NEGATIVE
Mucus, UA: NEGATIVE
Yeast, UA: NEGATIVE

## 2015-05-11 LAB — POCT URINALYSIS DIPSTICK
BILIRUBIN UA: NEGATIVE
Glucose, UA: NEGATIVE
KETONES UA: NEGATIVE
Leukocytes, UA: NEGATIVE
Nitrite, UA: NEGATIVE
PH UA: 6.5
Protein, UA: NEGATIVE
SPEC GRAV UA: 1.015
Urobilinogen, UA: 0.2

## 2015-05-11 MED ORDER — MELOXICAM 15 MG PO TABS: 15.0000 mg | ORAL_TABLET | Freq: Every day | ORAL | Status: DC

## 2015-05-11 NOTE — Progress Notes (Signed)
Subjective:  This chart was scribed for Mckenzie Sia, MD by [name], Medical Scribe. This patient was seen in Room 11 and the patient's care was started at 9:01 AM.     Patient ID: Mckenzie Vasquez, female    DOB: Jun 28, 1951, 64 y.o.   MRN: [id number]  HPI Mckenzie Vasquez is a 64 y.o. female who presents to [facility] for follow up.  Pt says that her back is feeling better with the meloxicam.  doing well Se hx stiffness and espec LBP--melox "a miracle" BP stable Weight unchanged She has made a new appointment for sleep study on Monday.   She also has a pinching feeling in her bladder that started yesterday. She describes the pain being constantly there with a small increase in urgency. She denies an increase in frequency. No dysuria  She traveled to [place] in January and also visited [place].    Patient Active Problem List   Diagnosis Date Noted   Pain, joint, multiple sites 10/24/2013   Sleep apnea 10/24/2013   BMI 38.0-38.9,adult 11/07/2012   Intrinsic asthma 11/07/2012   OBESITY 10/25/2007   HYPERTENSION, BENIGN 10/25/2007   GERD 10/25/2007   HIATAL HERNIA 10/25/2007   INTERSTITIAL CYSTITIS 10/25/2007   ENDOMETRIOSIS 10/25/2007   FIBROCYSTIC BREAST DISEASE, HX OF 10/25/2007    Current outpatient prescriptions:    esomeprazole (NEXIUM) 40 MG capsule, Take 40 mg by mouth daily before breakfast., Disp: , Rfl:    Estradiol (VAGIFEM VA), Place 1 application vaginally daily., Disp: , Rfl:    hydrocortisone (ANUCORT-HC) 25 MG suppository, Place 1 suppository (25 mg total) rectally 2 (two) times daily. PATIENT NEEDS OFFICE VISIT FOR ADDITIONAL REFILLS, Disp: 28 suppository, Rfl: 0   hydrocortisone (ANUSOL-HC) 25 MG suppository, Place 1 suppository (25 mg total) rectally 2 (two) times daily., Disp: 28 suppository, Rfl: 2   lisinopril-hydrochlorothiazide (PRINZIDE,ZESTORETIC) 10-12.5 MG per tablet, TAKE 1 TABLET ONCE DAILY., Disp: 30 tablet, Rfl: 2   meloxicam  (MOBIC) 15 MG tablet, Take 1 tablet (15 mg total) by mouth daily. NO MORE REFILLS WITHOUT OFFICE VISIT - 2ND NOTICE, Disp: 15 tablet, Rfl: 0   OPTIVAR 0.05 % ophthalmic solution, INSTILL 1 DROP IN EACH EYE TWICE DAILY., Disp: 6 mL, Rfl: 0   PROCTOZONE-HC 2.5 % rectal cream, APPLY RECTALLY TWICE DAILY AS DIRECTED., Disp: 30 g, Rfl: 3   albuterol (PROAIR HFA) 108 (90 BASE) MCG/ACT inhaler, Inhale 2 puffs into the lungs every 4 (four) hours as needed. (Patient not taking: Reported on 08/05/2014), Disp: 1 Inhaler, Rfl: 12   cyclobenzaprine (FLEXERIL) 10 MG tablet, Take 1 tablet (10 mg total) by mouth at bedtime. (Patient not taking: Reported on 05/11/2015), Disp: 30 tablet, Rfl: 3   Fluticasone-Salmeterol (ADVAIR DISKUS) 250-50 MCG/DOSE AEPB, Inhale 1 puff into the lungs 2 (two) times daily. (Patient not taking: Reported on 05/11/2015), Disp: 60 each, Rfl: 0   HYDROCORTISONE ACE, RECTAL, 30 MG SUPP, Place 1 suppository (30 mg total) rectally 2 (two) times daily., Disp: 28 each, Rfl: 2   typhoid (VIVOTIF BERNA VACCINE) DR capsule, Take 1 capsule by mouth every other day. Take with lukewarm water on empty stomach (Patient not taking: Reported on 05/11/2015), Disp: 4 capsule, Rfl: 0    Review of Systems  Constitutional: Negative for fever, chills and fatigue.  HENT: Negative for congestion, rhinorrhea and sore throat.   Gastrointestinal: Negative for nausea, vomiting, diarrhea and constipation.  Genitourinary: Positive for urgency. Negative for dysuria and frequency.  Musculoskeletal: Negative for back pain.  Objective:   Physical Exam  Constitutional: She is oriented to person, place, and time. She appears well-developed and well-nourished. No distress.  HENT:  Head: Normocephalic and atraumatic.  Eyes: EOM are normal. Pupils are equal, round, and reactive to light.  Neck: Neck supple.  Cardiovascular: Normal rate.   Pulmonary/Chest: Effort normal. No respiratory distress.    Musculoskeletal: Normal range of motion.  SLR to 90 wnl  Neurological: She is alert and oriented to person, place, and time.  Skin: Skin is warm and dry.  Psychiatric: She has a normal mood and affect. Her behavior is normal.  Nursing note and vitals reviewed.   BP 132/80 mmHg   Pulse 78   Temp(Src) 98.6 F (37 C) (Oral)   Resp 18   Ht 5\' 4"  (1.626 m)   Wt 194 lb (87.998 kg)   BMI 33.28 kg/m2   SpO2 99%  Wt Readings from Last 3 Encounters:  05/11/15 194 lb (87.998 kg)  08/05/14 194 lb (87.998 kg)  01/21/14 218 lb 12.8 oz (99.247 kg)   Results for orders placed or performed in visit on 05/11/15  POCT UA - Microscopic Only  Result Value Ref Range   WBC, Ur, HPF, POC 0-2    RBC, urine, microscopic 0-1    Bacteria, U Microscopic neg    Mucus, UA neg    Epithelial cells, urine per micros 0-3    Crystals, Ur, HPF, POC neg    Casts, Ur, LPF, POC neg    Yeast, UA neg   POCT urinalysis dipstick  Result Value Ref Range   Color, UA yellow    Clarity, UA clear    Glucose, UA neg    Bilirubin, UA neg    Ketones, UA neg    Spec Grav, UA 1.015    Blood, UA trace-lysed    pH, UA 6.5    Protein, UA neg    Urobilinogen, UA 0.2    Nitrite, UA neg    Leukocytes, UA Negative Negative        Assessment & Plan:  Urinary symptom or sign - Plan: POCT UA - Microscopic Only, POCT urinalysis dipstick to follow--? Gi vs gu  Multiple arthralgias/stiffness LBP -ref melox -Yoga!!  May call for other med refills if needed for at least 6mos Sleep study pending

## 2015-05-12 ENCOUNTER — Ambulatory Visit (INDEPENDENT_AMBULATORY_CARE_PROVIDER_SITE_OTHER): Payer: BLUE CROSS/BLUE SHIELD | Admitting: Neurology

## 2015-05-12 ENCOUNTER — Encounter: Payer: Self-pay | Admitting: Neurology

## 2015-05-12 VITALS — BP 130/82 | HR 80 | Resp 16 | Ht 63.0 in | Wt 217.0 lb

## 2015-05-12 DIAGNOSIS — G478 Other sleep disorders: Secondary | ICD-10-CM

## 2015-05-12 DIAGNOSIS — R0683 Snoring: Secondary | ICD-10-CM | POA: Diagnosis not present

## 2015-05-12 DIAGNOSIS — R351 Nocturia: Secondary | ICD-10-CM | POA: Diagnosis not present

## 2015-05-12 DIAGNOSIS — G4719 Other hypersomnia: Secondary | ICD-10-CM

## 2015-05-12 NOTE — Progress Notes (Signed)
Subjective:    Patient ID: Mckenzie Vasquez is a 64 y.o. female.  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 The patient is unaccompanied today. I first met her on 12/13/2013 at the request of her primary care physician, at which time she reported snoring and difficulty with sleep maintenance as well as nonrestorative sleep. I invited her back for sleep study but she called back on 01/07/2014 requesting that we postpone the sleep study due to financial reasons and high deductible.   Today, 05/12/2015: She reports no significant changes in her sleep related complaints. She continues to have snoring and wakes up not refreshed. She does drink 3-4 cans of [facility] and some tea during the day to help her keep awake and alert. She works full-time as a Engineer, water. In the interim, her weight has remained about the same. She denies any significant morning headaches but goes to the bathroom at least once in the early morning hours. Since I first met her, her son has gotten married and she became the first time grandmother as her daughter had her first child. She saw Dr. Laney Pastor yesterday for her arthritis. Her Epworth sleepiness score is 5 out of a maximum of 24 today, however, this is in the context of drinking 3-4 cans of [facility] and some tea every day. Without this she feels that she would be much more sleepy.  Previously:   12/13/2013: She reports snoring, and problems with her sleep, including non-restorative sleep and sleep maintenance.  Her typical bedtime is reported to be around 11 PM and usual wake time is around 5:30 AM. Sleep onset typically  occurs within 20 minutes. She reports feeling poorly rested upon awakening. She wakes up on an average 1 times in the middle of the night and has to go to the bathroom 0 times on a typical night. She denies morning headaches.   She reports excessive daytime somnolence (EDS) and Her Epworth Sleepiness Score (ESS) is 3/24 today. However, she reports drinking a lot of [facility] during the day, up to 5-6 cans. She has not fallen asleep while driving, but has come close to dozing off, but would always pull over if the were sleepy. The patient has not been taking a scheduled nap.   She has been known to snore for the past many years. Snoring is reportedly marked, and not clear if associated with choking sounds and witnessed apneas. The patient denies a sense of choking or strangling feeling. There is report of nighttime reflux, with occasional nighttime cough experienced. The patient has not noted any RLS symptoms and is not known to kick while asleep or before falling asleep. There is no family history of RLS or OSA.   She is a restless sleeper and in the morning, the bed is quite disheveled.    She denies cataplexy, sleep paralysis, hypnagogic or hypnopompic hallucinations, or sleep attacks. She does not report any vivid dreams, nightmares, dream enactments, or parasomnias, such as sleep talking or sleep walking. The patient has not had a sleep study or a home sleep test.   Her bedroom is usually dark and cool. There is a TV in the bedroom and usually it is on at night.  Her Past Medical History Is Significant For: Past Medical History  Diagnosis Date  . Hypertension   . GERD (gastroesophageal reflux disease)   . Asthma   . Diverticulitis large intestine 2012    pt. states this is ongoing and sometimes worse than others  . Arthritis     Her Past Surgical History Is Significant For: Past Surgical History  Procedure Laterality Date  . Cholecystectomy    . Breast biopsy      left  .  Tonsillectomy    . Exploratory laparotomy      x2; oophorectomyx1  . Laparotomy  1997  . Laparoscopic endometriosis fulguration  1997    x3  . Colonoscopy    . Esophagogastroduodenoscopy    . Oophorectomy      Her Family History Is Significant For: Family History  Problem Relation Age of Onset  . Colon cancer Maternal Grandfather   . Colon cancer Other   . Colon cancer Paternal Grandfather   . Stroke Paternal Grandfather   . Alzheimer's disease Mother   . COPD Father   . Stroke Father   . Anxiety disorder Daughter     Her Social History Is Significant For: Social History   Social History  . Marital Status: Married    Spouse Name: N/A  . Number of Children: 2  . Years of Education: PhD   Social History Main Topics  . Smoking status: Never Smoker   . Smokeless tobacco: Never Used  . Alcohol Use: No  . Drug Use: No  . Sexual Activity: Yes   Other Topics Concern  . None   Social History Narrative   3-4 sodas a day     Her Allergies Are:  Allergies  Allergen Reactions  . Sulfa Antibiotics Rash  :   Her Current Medications Are:  Outpatient Encounter Prescriptions as of 05/12/2015  Medication Sig  . albuterol (PROAIR HFA) 108 (90 BASE) MCG/ACT inhaler Inhale 2 puffs into the lungs every 4 (four) hours as needed.  . cyclobenzaprine (FLEXERIL) 10 MG tablet Take 1 tablet (10 mg total) by mouth at bedtime.  Marland Kitchen esomeprazole (NEXIUM) 40 MG capsule Take 40 mg by mouth daily before breakfast.  . Estradiol (VAGIFEM VA) Place 1 application vaginally daily.  . Fluticasone-Salmeterol (ADVAIR DISKUS) 250-50 MCG/DOSE AEPB Inhale 1 puff into the lungs 2 (two) times daily.  . hydrocortisone (ANUCORT-HC) 25 MG suppository Place 1 suppository (25 mg total) rectally 2 (two) times daily. PATIENT NEEDS OFFICE VISIT FOR ADDITIONAL REFILLS  . hydrocortisone (ANUSOL-HC) 25 MG suppository Place 1 suppository (25 mg total) rectally 2 (two) times daily.  Marland Kitchen HYDROCORTISONE ACE, RECTAL, 30 MG  SUPP Place 1 suppository (30 mg total) rectally 2 (two) times daily.  Marland Kitchen lisinopril-hydrochlorothiazide (PRINZIDE,ZESTORETIC) 10-12.5 MG per tablet TAKE 1 TABLET ONCE DAILY.  . meloxicam (MOBIC) 15 MG tablet Take 1 tablet (15 mg total) by mouth daily.  . OPTIVAR 0.05 % ophthalmic solution INSTILL 1 DROP IN EACH EYE TWICE DAILY.  Marland Kitchen PROCTOZONE-HC 2.5 % rectal cream APPLY RECTALLY TWICE DAILY AS DIRECTED.  . [DISCONTINUED] typhoid (VIVOTIF BERNA VACCINE) DR capsule Take 1 capsule by mouth every other day. Take with lukewarm water on empty stomach (Patient not taking: Reported on 05/11/2015)   No facility-administered encounter medications on file as of 05/12/2015.  :  Review of Systems:  Out of a complete 14 point review of systems, all are reviewed and negative with the exception of these symptoms as listed below:    Review of  Systems  Constitutional: Positive for fatigue.  Respiratory:       Snoring   Neurological:       No trouble falling asleep, trouble staying asleep, snoring, no witnessed apnea, wakes up feeling tired in the morning, sometimes wakes up headaches, denies taking naps.     Objective:  Neurologic Exam  Physical Exam Physical Examination:   Filed Vitals:   05/12/15 1030  BP: 130/82  Pulse: 80  Resp: 16    General Examination: The patient is a very pleasant 64 y.o. female in no acute distress. She appears well-developed and well-nourished and well groomed. She is overweight.   HEENT: Normocephalic, atraumatic, pupils are equal, round and reactive to light and accommodation. Funduscopic exam is normal with sharp disc margins noted. Extraocular tracking is good without limitation to gaze excursion or nystagmus noted. Normal smooth pursuit is noted. Hearing is grossly intact. Face is symmetric with normal facial animation and normal facial sensation. Speech is clear with no dysarthria noted. There is no hypophonia. There is no lip, neck/head, jaw or voice tremor. Neck is  supple with full range of passive and active motion. There are no carotid bruits on auscultation. Oropharynx exam reveals: mild mouth dryness, adequate dental hygiene and moderate airway crowding, due to narrow airway entry and slightly redundant soft palate. Mallampati is class II. Tongue protrudes centrally and palate elevates symmetrically. Tonsils are absent.   Chest: Clear to auscultation without wheezing, rhonchi or crackles noted.  Heart: S1+S2+0, regular and normal without murmurs, rubs or gallops noted.   Abdomen: Soft, non-tender and non-distended with normal bowel sounds appreciated on auscultation.  Extremities: There is no pitting edema in the distal lower extremities bilaterally. Pedal pulses are intact.  Skin: Warm and dry without trophic changes noted. There are no varicose veins.  Musculoskeletal: exam reveals no obvious joint deformities, tenderness or joint swelling or erythema.   Neurologically:  Mental status: The patient is awake, alert and oriented in all 4 spheres. Her immediate and remote memory, attention, language skills and fund of knowledge are appropriate. There is no evidence of aphasia, agnosia, apraxia or anomia. Speech is clear with normal prosody and enunciation. Thought process is linear. Mood is normal and affect is normal.  Cranial nerves II - XII are as described above under HEENT exam. In addition: shoulder shrug is normal with equal shoulder height noted. Motor exam: Normal bulk, strength and tone is noted. There is no drift, tremor or rebound. Romberg is negative. Reflexes are 2+ throughout. Babinski: Toes are flexor bilaterally. Fine motor skills and coordination: intact.  Cerebellar testing: No dysmetria or intention tremor on finger to nose testing. Heel to shin is unremarkable bilaterally. There is no truncal or gait ataxia.  Sensory exam: intact to light touch, pinprick, vibration, temperature sense in the upper and lower extremities.  Gait, station  and balance: She stands easily. No veering to one side is noted. No leaning to one side is noted. Posture is age-appropriate and stance is narrow based. Gait shows normal stride length and normal pace. No problems turning are noted. She turns en bloc. Tandem walk is unremarkable.  Assessment and Plan:   In summary, ALVITA FANA is a very pleasant 64 year old female with an underlying medical history of arthritis, obesity, reflux disease, hypertension, interstitial cystitis, endometriosis, asthma and fibrocystic disease, who presents for reevaluation of her sleep disturbance, in particular, concern for underlying obstructive sleep apnea. She reports snoring, nonrestorative sleep, nocturia and excessive daytime somnolence. Her history  and physical exam are indeed concerning for underlying obstructive sleep apnea (OSA).  I had a long chat with the patient about my findings and the diagnosis of OSA, its prognosis and treatment options. We talked about medical treatments, surgical interventions and non-pharmacological approaches. I explained in particular the risks and ramifications of untreated moderate to severe OSA, especially with respect to developing cardiovascular disease down the Road. I recommended the following at this time: sleep study with potential positive airway pressure titration.  she is willing to proceed with sleep testing. I will see her back afterwards. She would be willing to try CPAP.  I answered all her questions today and the patient was in agreement. I would like to see her back after the sleep study is completed and encouraged her to call or email through [place] with any interim questions, concerns, problems or updates.  I spent 20 minutes in total face-to-face time with the patient, more than 50% of which was spent in counseling and coordination of care, reviewing test results, reviewing medication and discussing or reviewing the diagnosis of OSA, its prognosis and treatment  options.

## 2015-05-12 NOTE — Patient Instructions (Signed)

## 2015-06-03 ENCOUNTER — Ambulatory Visit (INDEPENDENT_AMBULATORY_CARE_PROVIDER_SITE_OTHER): Payer: BLUE CROSS/BLUE SHIELD | Admitting: Neurology

## 2015-06-03 ENCOUNTER — Other Ambulatory Visit: Payer: Self-pay

## 2015-06-03 DIAGNOSIS — G4733 Obstructive sleep apnea (adult) (pediatric): Secondary | ICD-10-CM | POA: Diagnosis not present

## 2015-06-03 DIAGNOSIS — Z1231 Encounter for screening mammogram for malignant neoplasm of breast: Secondary | ICD-10-CM

## 2015-06-03 DIAGNOSIS — G4720 Circadian rhythm sleep disorder, unspecified type: Secondary | ICD-10-CM

## 2015-06-03 DIAGNOSIS — G479 Sleep disorder, unspecified: Secondary | ICD-10-CM

## 2015-06-04 NOTE — Sleep Study (Signed)
Please see the scanned sleep study interpretation located in the procedure tab in the chart view section.  

## 2015-06-06 ENCOUNTER — Telehealth: Payer: Self-pay | Admitting: Neurology

## 2015-06-06 DIAGNOSIS — G4733 Obstructive sleep apnea (adult) (pediatric): Secondary | ICD-10-CM

## 2015-06-06 NOTE — Telephone Encounter (Signed)
Patient referred by Dr. Merla Riches, seen by me most recently on 05/12/15, PSG on 06/03/15, Ins: [employer]:  Please call and notify the patient that the recent sleep study did confirm the diagnosis of overall mild obstructive sleep apnea, moderate in supine sleep, severe in REM sleep, and, given her sleep related complaints, I recommend treatment for this in the form of CPAP. This will require a repeat sleep study for proper titration and mask fitting. Please explain to patient and arrange for a CPAP titration study. I have placed an order in the chart. Thanks, and please route to [facility] for scheduling the CPAP study.   Huston Foley, MD, PhD [facility])

## 2015-06-10 NOTE — Telephone Encounter (Signed)
I spoke to patient and gave results and recommendation. She is willing to proceed. I transferred her to [facility] so to go ahead and get scheduled. I will fax report to PCP.

## 2015-06-22 ENCOUNTER — Ambulatory Visit (INDEPENDENT_AMBULATORY_CARE_PROVIDER_SITE_OTHER): Payer: BLUE CROSS/BLUE SHIELD

## 2015-06-22 DIAGNOSIS — Z23 Encounter for immunization: Secondary | ICD-10-CM

## 2015-07-03 ENCOUNTER — Ambulatory Visit (INDEPENDENT_AMBULATORY_CARE_PROVIDER_SITE_OTHER): Payer: BLUE CROSS/BLUE SHIELD | Admitting: Neurology

## 2015-07-03 DIAGNOSIS — G4761 Periodic limb movement disorder: Secondary | ICD-10-CM

## 2015-07-03 DIAGNOSIS — G4733 Obstructive sleep apnea (adult) (pediatric): Secondary | ICD-10-CM | POA: Diagnosis not present

## 2015-07-03 DIAGNOSIS — G479 Sleep disorder, unspecified: Secondary | ICD-10-CM

## 2015-07-04 NOTE — Sleep Study (Signed)
Please see the scanned sleep study interpretation located in the procedure tab within the chart review section.   

## 2015-07-08 ENCOUNTER — Telehealth: Payer: Self-pay | Admitting: Neurology

## 2015-07-08 DIAGNOSIS — G4733 Obstructive sleep apnea (adult) (pediatric): Secondary | ICD-10-CM

## 2015-07-08 NOTE — Telephone Encounter (Signed)
Pt called and is wondering if her [facility] for D[facility]. She also needs to know if there is anything else that she needs to do. Please call and advise [phone]. Please leave message if she doesn't answer. Thank you .

## 2015-07-09 ENCOUNTER — Ambulatory Visit
Admission: RE | Admit: 2015-07-09 | Discharge: 2015-07-09 | Disposition: A | Discharge status: Home / Self Care | Payer: BLUE CROSS/BLUE SHIELD | Source: Ambulatory Visit

## 2015-07-09 DIAGNOSIS — Z1231 Encounter for screening mammogram for malignant neoplasm of breast: Secondary | ICD-10-CM

## 2015-07-09 NOTE — Telephone Encounter (Signed)
See also Savannah's note.  Patient referred by Dr. Merla Richesoolittle, seen by me most recently on 05/12/15, PSG on 06/03/15, CPAP titration study on 07/03/15, Ins: [employer]:  Please call and inform patient that I have entered an order for treatment with positive airway pressure (PAP) treatment of obstructive sleep apnea (OSA). She did well during the latest sleep study with CPAP. We will, therefore, arrange for a machine for home use through a DME (durable medical equipment) company of Her choice; and I will see the patient back in follow-up in about 8-10 weeks. Please also explain to the patient that I will be looking out for compliance data, which can be downloaded from the machine (stored on an SD card, that is inserted in the machine) or via remote access through a modem, that is built into the machine. At the time of the followup appointment we will discuss sleep study results and how it is going with PAP treatment at home. Please advise patient to bring Her machine at the time of the first FU visit, even though this is cumbersome. Bringing the machine for every visit after that will likely not be needed, but often helps for the first visit to troubleshoot if needed. Please re-enforce the importance of compliance with treatment and the need for us to monitor compliance data - often an insurance requirement and actually good feedback for the patient as far as how they are doing.  Also remind patient, that any interim PAP machine or mask issues should be first addressed with the DME company, as they can often help better with technical and mask fit issues. Please ask if patient has a preference regarding DME company.  Please also make sure, the patient has a follow-up appointment with me in about 8-10 weeks from the setup date, thanks.  Once you have spoken to the patient - and faxed/routed report to PCP and referring MD (if other than PCP), you can close this encounter, thanks,   Huston FoleySaima Drayson Dorko, MD, PhD [facility] ([facility])

## 2015-07-09 NOTE — Telephone Encounter (Signed)
I spoke to patient and gave her results. SHe is anxious to start treatment. I will refer to [facility], patient agreed. I will fax report to PCP. We were able to make f/u appt. They date that patient requested may be too soon (to meet insurance requirements) but patient was made aware of this. States that she will change appt if need be. I will send patient letter reminding her of appt and stress importance of compliance.

## 2015-08-11 ENCOUNTER — Encounter: Payer: Self-pay | Admitting: Internal Medicine

## 2015-08-25 ENCOUNTER — Encounter: Payer: Self-pay | Admitting: Neurology

## 2015-08-25 ENCOUNTER — Ambulatory Visit (INDEPENDENT_AMBULATORY_CARE_PROVIDER_SITE_OTHER): Payer: BLUE CROSS/BLUE SHIELD | Admitting: Neurology

## 2015-08-25 VITALS — BP 132/72 | HR 78 | Resp 16 | Ht 63.0 in | Wt 221.0 lb

## 2015-08-25 DIAGNOSIS — G479 Sleep disorder, unspecified: Secondary | ICD-10-CM | POA: Diagnosis not present

## 2015-08-25 DIAGNOSIS — Z9989 Dependence on other enabling machines and devices: Secondary | ICD-10-CM

## 2015-08-25 DIAGNOSIS — G4733 Obstructive sleep apnea (adult) (pediatric): Secondary | ICD-10-CM

## 2015-08-25 MED ORDER — ZALEPLON 5 MG PO CAPS: 5.0000 mg | ORAL_CAPSULE | Freq: Every evening | ORAL | Status: DC | PRN

## 2015-08-25 NOTE — Progress Notes (Signed)
Subjective:    Patient ID: Mckenzie Vasquez is a 64 y.o. female.  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 The patient is unaccompanied today. I last saw her on 05/12/2015 for reevaluation of her sleep apnea history. She reported snoring and difficulty with sleep maintenance as well as not waking up rested. I invited her back for a sleep study. She her baseline sleep study, followed by a CPAP titration study, and I went over her test results with her in detail today. Her baseline sleep study from 06/03/2015, showed a sleep efficiency of 82.3% with a prolonged sleep latency of 36 minutes and wake after sleep onset of 32.5 minutes with mild to moderate sleep fragmentation. She had a mildly elevated arousal index. She had a normal percentage of light stage sleep, an increased percentage of slow-wave sleep, and a mildly decreased percentage of REM sleep with a prolonged REM latency. She had no significant PLMS, EKG or EEG changes. She had intermittent mild to moderate and prayer loud snoring. Total AHI was 8.3 per hour, rising to 39.2 per hour during REM sleep and 22.2 per hour in the supine position. Average oxygen saturation was 94%, nadir was 87%. Time below 90% saturation was nearly 3 minutes. Based on her sleep-related complaints and her test results I asked her to return for full night CPAP titration study. She had this on 07/03/2015. Sleep efficiency was 83.2%, sleep latency 21.5 minutes and wake after sleep onset 44 minutes with mild sleep fragmentation noted. She had a mildly elevated arousal index, normal percentages for light stage sleep, an increased percentage of slow-wave  sleep, and a mildly decreased percentage of REM sleep with a mildly prolonged REM latency. She had moderate PLMS with an index of 47.4 per hour, resulting in 8.3 arousals per hour. She had no significant EKG or EEG changes. CPAP was titrated from 5 cm to 7 cm. AHI was 0 per hour at the final pressure. Supine REM sleep was achieved. Average oxygen saturation was 94%, nadir was 90%. Based on her test results are prescribed CPAP therapy for home use.   Today, 08/25/2015: I reviewed her CPAP compliance data from 07/22/2015 through 08/20/2015 which is a total of 30 days during which time she used her machine every night with percent used days greater than 4 hours at 100%, indicating superb compliance with an average usage of 6 hours and 49 minutes, residual AHI low at 0.8 per hour, leak low with the 95th percentile at 3.4 L/m on a pressure of 7 cm with EPR of 3.  Today, 08/25/2015: She reports a recent bout of pinkeye bilaterally. She is improving. Weight has been stable. She has problems going back to sleep when she wakes up in the middle of the night. Overall, she believes sleep quality is better but she is still bothered by her inability to go to sleep sometimes after 3:30 PM. She will wake up usually between 1:30 into for the first time and then again around 3:30 and sometimes cannot go back to sleep after 3:30 or 4 AM. She has been compliant with treatment. She has adjusted to treatment fairly well, better than she anticipated.  Previously:   05/12/2015: She reports no significant changes in her  sleep related complaints. She continues to have snoring and wakes up not refreshed. She does drink 3-4 cans of [facility] and some tea during the day to help her keep awake and alert. She works full-time as a Engineer, water. In the interim, her weight has remained about the same. She denies any significant morning headaches but goes to the bathroom at least once in the early morning hours. Since I first met her, her  son has gotten married and she became the first time grandmother as her daughter had her first child. She saw Dr. Laney Pastor yesterday for her arthritis. Her Epworth sleepiness score is 5 out of a maximum of 24 today, however, this is in the context of drinking 3-4 cans of [facility] and some tea every day. Without this she feels that she would be much more sleepy.  I first met her on 12/13/2013 at the request of her primary care physician, at which time she reported snoring and difficulty with sleep maintenance as well as nonrestorative sleep. I invited her back for sleep study but she called back on 01/07/2014 requesting that we postpone the sleep study due to financial reasons and high deductible.   12/13/2013: She reports snoring, and problems with her sleep, including non-restorative sleep and sleep maintenance.   Her typical bedtime is reported to be around 11 PM and usual wake time is around 5:30 AM. Sleep onset typically occurs within 20 minutes. She reports feeling poorly rested upon awakening. She wakes up on an average 1 times in the middle of the night and has to go to the bathroom 0 times on a typical night. She denies morning headaches.   She reports excessive daytime somnolence (EDS) and Her Epworth Sleepiness Score (ESS) is 3/24 today. However, she reports drinking a lot of [facility] during the day, up to 5-6 cans. She has not fallen asleep while driving, but has come close to dozing off, but would always pull over if the were sleepy. The patient has not been taking a scheduled nap.   She has been known to snore for the past many years. Snoring is reportedly marked, and not clear if associated with choking sounds and witnessed apneas. The patient denies a sense of choking or strangling feeling. There is report of nighttime reflux, with occasional nighttime cough experienced. The patient has not noted any RLS symptoms and is not known to kick while asleep or before falling asleep. There is  no family history of RLS or OSA.   She is a restless sleeper and in the morning, the bed is quite disheveled.    She denies cataplexy, sleep paralysis, hypnagogic or hypnopompic hallucinations, or sleep attacks. She does not report any vivid dreams, nightmares, dream enactments, or parasomnias, such as sleep talking or sleep walking. The patient has not had a sleep study or a home sleep test.   Her bedroom is usually dark and cool. There is a TV in the bedroom and usually it is on at night.      Her Past Medical History Is Significant For: Past Medical History  Diagnosis Date  . Hypertension   . GERD (gastroesophageal reflux disease)   . Asthma   . Diverticulitis large intestine 2012    pt. states this is ongoing and sometimes worse than others  . Arthritis     Her Past Surgical History Is Significant For: Past Surgical History  Procedure Laterality Date  . Cholecystectomy    . Breast biopsy  left  . Tonsillectomy    . Exploratory laparotomy      x2; oophorectomyx1  . Laparotomy  1997  . Laparoscopic endometriosis fulguration  1997    x3  . Colonoscopy    . Esophagogastroduodenoscopy    . Oophorectomy      Her Family History Is Significant For: Family History  Problem Relation Age of Onset  . Colon cancer Maternal Grandfather   . Colon cancer Other   . Colon cancer Paternal Grandfather   . Stroke Paternal Grandfather   . Alzheimer's disease Mother   . COPD Father   . Stroke Father   . Anxiety disorder Daughter     Her Social History Is Significant For: Social History   Social History  . Marital Status: Married    Spouse Name: N/A  . Number of Children: 2  . Years of Education: PhD   Social History Main Topics  . Smoking status: Never Smoker   . Smokeless tobacco: Never Used  . Alcohol Use: No  . Drug Use: No  . Sexual Activity: Yes   Other Topics Concern  . None   Social History Narrative   3-4 sodas a day     Her Allergies Are:  Allergies   Allergen Reactions  . Sulfa Antibiotics Rash  :   Her Current Medications Are:  Outpatient Encounter Prescriptions as of 08/25/2015  Medication Sig  . albuterol (PROAIR HFA) 108 (90 BASE) MCG/ACT inhaler Inhale 2 puffs into the lungs every 4 (four) hours as needed.  . cyclobenzaprine (FLEXERIL) 10 MG tablet Take 1 tablet (10 mg total) by mouth at bedtime. (Patient taking differently: Take 10 mg by mouth at bedtime. )  . esomeprazole (NEXIUM) 40 MG capsule Take 40 mg by mouth daily before breakfast.  . Estradiol (VAGIFEM VA) Place 1 application vaginally daily.  . hydrocortisone (ANUCORT-HC) 25 MG suppository Place 1 suppository (25 mg total) rectally 2 (two) times daily. PATIENT NEEDS OFFICE VISIT FOR ADDITIONAL REFILLS  . hydrocortisone (ANUSOL-HC) 25 MG suppository Place 1 suppository (25 mg total) rectally 2 (two) times daily.  Marland Kitchen HYDROCORTISONE ACE, RECTAL, 30 MG SUPP Place 1 suppository (30 mg total) rectally 2 (two) times daily.  Marland Kitchen lisinopril-hydrochlorothiazide (PRINZIDE,ZESTORETIC) 10-12.5 MG per tablet TAKE 1 TABLET ONCE DAILY.  . meloxicam (MOBIC) 15 MG tablet Take 1 tablet (15 mg total) by mouth daily.  . OPTIVAR 0.05 % ophthalmic solution INSTILL 1 DROP IN EACH EYE TWICE DAILY.  Marland Kitchen PROCTOZONE-HC 2.5 % rectal cream APPLY RECTALLY TWICE DAILY AS DIRECTED.  . [DISCONTINUED] Fluticasone-Salmeterol (ADVAIR DISKUS) 250-50 MCG/DOSE AEPB Inhale 1 puff into the lungs 2 (two) times daily.   No facility-administered encounter medications on file as of 08/25/2015.  :  Review of Systems:  Out of a complete 14 point review of systems, all are reviewed and negative with the exception of these symptoms as listed below:   Review of Systems  Eyes:       Pink Eye  Neurological:       Patient reports that she has trouble sleeping with CPAP. Wakes up many times during the night and has trouble going back to sleep.     Objective:  Neurologic Exam  Physical Exam Physical Examination:    Filed Vitals:   08/25/15 0859  BP: 132/72  Pulse: 78  Resp: 16    General Examination: The patient is a very pleasant 64 y.o. female in no acute distress. She appears well-developed and well-nourished and well groomed. She is overweight.  HEENT: Normocephalic, atraumatic, pupils are equal, round and reactive to light and accommodation. Extraocular tracking is good without limitation to gaze excursion or nystagmus noted. Normal smooth pursuit is noted. Hearing is grossly intact. Face is symmetric with normal facial animation and normal facial sensation. Speech is clear with no dysarthria noted. There is no hypophonia. There is no lip, neck/head, jaw or voice tremor. Neck is supple with full range of passive and active motion. There are no carotid bruits on auscultation. Oropharynx exam reveals: mild mouth dryness, adequate dental hygiene and moderate airway crowding, due to narrow airway entry and slightly redundant soft palate. Mallampati is class II. Tongue protrudes centrally and palate elevates symmetrically. Tonsils are absent.   Chest: Clear to auscultation without wheezing, rhonchi or crackles noted.  Heart: S1+S2+0, regular and normal without murmurs, rubs or gallops noted.   Abdomen: Soft, non-tender and non-distended with normal bowel sounds appreciated on auscultation.  Extremities: There is no pitting edema in the distal lower extremities bilaterally. Pedal pulses are intact.  Skin: Warm and dry without trophic changes noted. There are no varicose veins.  Musculoskeletal: exam reveals no obvious joint deformities, tenderness or joint swelling or erythema.   Neurologically:  Mental status: The patient is awake, alert and oriented in all 4 spheres. Her immediate and remote memory, attention, language skills and fund of knowledge are appropriate. There is no evidence of aphasia, agnosia, apraxia or anomia. Speech is clear with normal prosody and enunciation. Thought process is  linear. Mood is normal and affect is normal.  Cranial nerves II - XII are as described above under HEENT exam. In addition: shoulder shrug is normal with equal shoulder height noted. Motor exam: Normal bulk, strength and tone is noted. There is no drift, tremor or rebound. Romberg is negative. Reflexes are 2+ throughout. Fine motor skills and coordination: intact.  Cerebellar testing: No dysmetria or intention tremor on finger to nose testing. Heel to shin is unremarkable bilaterally. There is no truncal or gait ataxia.  Sensory exam: intact to light touch in the upper and lower extremities.  Gait, station and balance: She stands easily. No veering to one side is noted. No leaning to one side is noted. Posture is age-appropriate and stance is narrow based. Gait shows normal stride length and normal pace. No problems turning are noted. She turns en bloc. Tandem walk is difficult for her today.   Assessment and Plan:   In summary, TAYLR MEUTH is a very pleasant 64 year old female with an underlying medical history of arthritis, obesity, reflux disease, hypertension, interstitial cystitis, endometriosis, asthma and fibrocystic disease, who presents for follow-up consultation of her obstructive sleep apnea, after her sleep studies. She had a baseline sleep study on 06/03/2015, followed by a CPAP titration study on 07/03/2015, I discussed her sleep study results with her in detail today. We reviewed her compliance data. While she is fully compliant with treatment, she has trouble maintaining sleep and is still adjusting to treatment. I explained to her that it can take several weeks to up to 3 months at times to get used to CPAP therapy. She had improved sleep disordered breathing with CPAP therapy. Thankfully, she does not require a high pressure. I suggested  Mckenzie Vasquez 5 mg strength for his sleep maintenance issues. I advised her not to take it after 2 AM and not to exceed it 3 times during any week. She is  advised about potential side effects including sedation, as well as parasomnias. She is also  advised about the potential addictive properties of prescription sleep aids. She is agreeable to trying this . As far as subjective improvement of her sleep, she reports  improved sleep quality. We again talked about the risks and ramifications of untreated moderate to severe OSA, especially with respect to developing cardiovascular disease down the Road. She reports that her mother has been diagnosed with Alzheimer's. She worries about this.  I recommended the following at this time: continue CPAP therapy. trial of Mckenzie Vasquez for which I gave her a new prescription.  Her exam is stable. FU in about 6 months, sooner if needed.  I answered all her questions today and the patient was in agreement.  I spent 20 minutes in total face-to-face time with the patient, more than 50% of which was spent in counseling and coordination of care, reviewing test results, reviewing medication and discussing or reviewing the diagnosis of OSA, its prognosis and treatment options.

## 2015-08-25 NOTE — Patient Instructions (Addendum)
Please continue using your CPAP regularly. While your insurance requires that you use CPAP at least 4 hours each night on 70% of the nights, I recommend, that you not skip any nights and use it throughout the night if you can. Getting used to CPAP and staying with the treatment long term does take time and patience and discipline. Untreated obstructive sleep apnea when it is moderate to severe can have an adverse impact on cardiovascular health and raise her risk for heart disease, arrhythmias, hypertension, congestive heart failure, stroke and diabetes. Untreated obstructive sleep apnea causes sleep disruption, nonrestorative sleep, and sleep deprivation. This can have an impact on your day to day functioning and cause daytime sleepiness and impairment of cognitive function, memory loss, mood disturbance, and problems focussing. Using CPAP regularly can improve these symptoms.  Keep up the good work! I will see you back in 6 months for sleep apnea check up, and if you continue to do well on CPAP I will see you once a year thereafter.   For your sleep maintenance issues, let's try sonata 5 mg as needed. Take one pill as needed when you wake up in the middle of night, take no later than 2 AM. No more than 3 times a week. Side effects include sleepiness, abnormal dreams, sleep walking, sleep talking or other unwanted behaviors in sleep.

## 2015-11-06 ENCOUNTER — Ambulatory Visit (INDEPENDENT_AMBULATORY_CARE_PROVIDER_SITE_OTHER): Payer: BLUE CROSS/BLUE SHIELD | Admitting: Family Medicine

## 2015-11-06 ENCOUNTER — Ambulatory Visit (INDEPENDENT_AMBULATORY_CARE_PROVIDER_SITE_OTHER): Payer: BLUE CROSS/BLUE SHIELD

## 2015-11-06 ENCOUNTER — Telehealth: Payer: Self-pay | Admitting: Family Medicine

## 2015-11-06 VITALS — BP 122/81 | HR 100 | Temp 102.1°F | Resp 16 | Ht 63.0 in | Wt 215.0 lb

## 2015-11-06 DIAGNOSIS — B349 Viral infection, unspecified: Secondary | ICD-10-CM | POA: Diagnosis not present

## 2015-11-06 DIAGNOSIS — R6889 Other general symptoms and signs: Secondary | ICD-10-CM | POA: Diagnosis not present

## 2015-11-06 DIAGNOSIS — R05 Cough: Secondary | ICD-10-CM | POA: Diagnosis not present

## 2015-11-06 DIAGNOSIS — R0781 Pleurodynia: Secondary | ICD-10-CM | POA: Diagnosis not present

## 2015-11-06 DIAGNOSIS — R509 Fever, unspecified: Secondary | ICD-10-CM

## 2015-11-06 DIAGNOSIS — R059 Cough, unspecified: Secondary | ICD-10-CM

## 2015-11-06 DIAGNOSIS — J45909 Unspecified asthma, uncomplicated: Secondary | ICD-10-CM

## 2015-11-06 LAB — POCT INFLUENZA A/B
Influenza A, POC: NEGATIVE
Influenza B, POC: NEGATIVE

## 2015-11-06 MED ORDER — IBUPROFEN 200 MG PO TABS
600.0000 mg | ORAL_TABLET | Freq: Once | ORAL | Status: AC
  Administered 2015-11-06: 600 mg via ORAL

## 2015-11-06 MED ORDER — HYDROCODONE-HOMATROPINE 5-1.5 MG/5ML PO SYRP: 5.0000 mL | ORAL_SOLUTION | Freq: Every evening | ORAL | Status: DC | PRN

## 2015-11-06 MED ORDER — AZITHROMYCIN 250 MG PO TABS: ORAL_TABLET | ORAL | Status: DC

## 2015-11-06 MED ORDER — OSELTAMIVIR PHOSPHATE 75 MG PO CAPS: 75.0000 mg | ORAL_CAPSULE | Freq: Two times a day (BID) | ORAL | Status: DC

## 2015-11-06 NOTE — Telephone Encounter (Signed)
Spoke with patient about official xray results, wants to cover  For PNA as well. Take z pack .

## 2015-11-06 NOTE — Progress Notes (Signed)
Chief Complaint:  Chief Complaint  Patient presents with  . Fever    x 1 day  . Cough    x 4 days    HPI: Mckenzie Vasquez is a 65 y.o. female who reports to [facility] today complaining of fever last night T max She has  Had pleuritic CP since  She has tried cough syrup , otc .  Fatigue but mask aches last night night Sh ehas had facial pain   1 week ago flew and dorwve back home 7 hours, drive, no piro blood clots, she has asthma, she ahs no  Leg swelling.    Past Medical History  Diagnosis Date  . Hypertension   . GERD (gastroesophageal reflux disease)   . Asthma   . Diverticulitis large intestine 2012    pt. states this is ongoing and sometimes worse than others  . Arthritis    Past Surgical History  Procedure Laterality Date  . Cholecystectomy    . Breast biopsy      left  . Tonsillectomy    . Exploratory laparotomy      x2; oophorectomyx1  . Laparotomy  1997  . Laparoscopic endometriosis fulguration  1997    x3  . Colonoscopy    . Esophagogastroduodenoscopy    . Oophorectomy     Social History   Social History  . Marital Status: Married    Spouse Name: N/A  . Number of Children: 2  . Years of Education: PhD   Social History Main Topics  . Smoking status: Never Smoker   . Smokeless tobacco: Never Used  . Alcohol Use: No  . Drug Use: No  . Sexual Activity: Yes   Other Topics Concern  . None   Social History Narrative   3-4 sodas a day    Family History  Problem Relation Age of Onset  . Colon cancer Maternal Grandfather   . Colon cancer Other   . Colon cancer Paternal Grandfather   . Stroke Paternal Grandfather   . Alzheimer's disease Mother   . COPD Father   . Stroke Father   . Anxiety disorder Daughter    Allergies  Allergen Reactions  . Sulfa Antibiotics Rash   Prior to Admission medications   Medication Sig Start Date End Date Taking? Authorizing Provider  albuterol (PROAIR HFA) 108 (90 BASE) MCG/ACT inhaler Inhale 2 puffs into  the lungs every 4 (four) hours as needed. 01/21/14  Yes Carmelina Dane, MD  cyclobenzaprine (FLEXERIL) 10 MG tablet Take 1 tablet (10 mg total) by mouth at bedtime. Patient taking differently: Take 10 mg by mouth at bedtime.  08/06/14  Yes Tonye Pearson, MD  Estradiol [facility]) Place 1 application vaginally daily.   Yes Historical Provider, MD  hydrocortisone (ANUCORT-HC) 25 MG suppository Place 1 suppository (25 mg total) rectally 2 (two) times daily. PATIENT NEEDS OFFICE VISIT FOR ADDITIONAL REFILLS 07/08/14  Yes Tonye Pearson, MD  hydrocortisone (ANUSOL-HC) 25 MG suppository Place 1 suppository (25 mg total) rectally 2 (two) times daily. 09/12/14  Yes Tonye Pearson, MD  HYDROCORTISONE ACE, RECTAL, 30 MG SUPP Place 1 suppository (30 mg total) rectally 2 (two) times daily. 05/05/12  Yes Ether Wolters P Rya Rausch, DO  lisinopril-hydrochlorothiazide (PRINZIDE,ZESTORETIC) 10-12.5 MG per tablet TAKE 1 TABLET ONCE DAILY. 11/10/14  Yes Tonye Pearson, MD  meloxicam (MOBIC) 15 MG tablet Take 1 tablet (15 mg total) by mouth daily. 05/11/15  Yes Tonye Pearson, MD  Leafy Kindle  0.05 % ophthalmic solution INSTILL 1 DROP IN [facility] EYE TWICE DAILY. 10/25/11  Yes Ryan M Dunn, PA-C  PROCTOZONE-HC 2.5 % rectal cream APPLY RECTALLY TWICE DAILY AS DIRECTED. 01/02/14  Yes Tonye Pearson, MD     ROS: The patient denies  night sweats, unintentional weight loss, chest pain, palpitations, wheezing, dyspnea on exertion, nausea, vomiting, abdominal pain, dysuria, hematuria, melena, numbness, weakness, or tingling.   All other systems have been reviewed and were otherwise negative with the exception of those mentioned in the HPI and as above.    PHYSICAL EXAM: Filed Vitals:   11/06/15 0811  BP: 122/81  Pulse: 100  Temp: 102.1 F (38.9 C)  Resp: 16   Body mass index is 38.09 kg/(m^2).   General: Alert, tired appearing HEENT:  Normocephalic, atraumatic, oropharynx patent. EOMI, PERRLA Erythematous throat,  no exudates, TM normal, + sinus tenderness, + erythematous/boggy nasal mucosa Cardiovascular:  Regular rate and rhythm, no rubs murmurs or gallops.  No Carotid bruits, radial pulse intact. No pedal edema.  Respiratory: Clear to auscultation bilaterally.  No wheezes, rales, or rhonchi.  No cyanosis, no use of accessory musculature Abdominal: No organomegaly, abdomen is soft and non-tender, positive bowel sounds. No masses. Skin: No rashes. Neurologic: Facial musculature symmetric. Psychiatric: Patient acts appropriately throughout our interaction. Lymphatic: No cervical or submandibular lymphadenopathy Musculoskeletal: Gait intact. No edema, tenderness   LABS: Results for orders placed or performed in visit on 11/06/15  POCT Influenza A/B  Result Value Ref Range   Influenza A, POC Negative Negative   Influenza B, POC Negative Negative     EKG/XRAY:   Primary read interpreted by Dr. Conley Rolls at [facility].   ASSESSMENT/PLAN: Encounter Diagnoses  Name Primary?  . Flu-like symptoms   . Pleuritic chest pain   . Cough   . Fever, unspecified   . Viral illness Yes   Neg chest xray Will presumptively treat for flu  Tamilfu  If she feels worse then wioll call me for abx  Since asthmatic.  Rx Hycodan  Prn cough   Gross sideeffects, risk and benefits, and alternatives of medications d/w patient. Patient is aware that all medications have potential sideeffects and we are unable to predict every sideeffect or drug-drug interaction that may occur.  Shelisha Gautier DO  11/06/2015 9:07 AM

## 2015-11-06 NOTE — Patient Instructions (Signed)
NOTE:  Because you received an x-ray today, you will receive an invoice from [facility]. Please contact [facility] at [phone] with questions or concerns regarding your invoice. Our billing staff will not be able to assist you with those questions.  

## 2015-12-29 ENCOUNTER — Other Ambulatory Visit: Payer: Self-pay | Admitting: Internal Medicine

## 2016-02-23 ENCOUNTER — Ambulatory Visit: Payer: BLUE CROSS/BLUE SHIELD | Admitting: Neurology

## 2016-05-07 ENCOUNTER — Other Ambulatory Visit: Payer: Self-pay

## 2016-05-07 MED ORDER — MELOXICAM 15 MG PO TABS: 15.0000 mg | ORAL_TABLET | Freq: Every day | ORAL | 0 refills | Status: DC

## 2016-06-11 ENCOUNTER — Other Ambulatory Visit: Payer: Self-pay | Admitting: Obstetrics and Gynecology

## 2016-06-11 DIAGNOSIS — Z1231 Encounter for screening mammogram for malignant neoplasm of breast: Secondary | ICD-10-CM

## 2016-06-28 ENCOUNTER — Ambulatory Visit: Payer: BLUE CROSS/BLUE SHIELD | Admitting: Neurology

## 2016-07-08 ENCOUNTER — Ambulatory Visit (INDEPENDENT_AMBULATORY_CARE_PROVIDER_SITE_OTHER): Payer: BLUE CROSS/BLUE SHIELD | Admitting: Family Medicine

## 2016-07-08 VITALS — BP 108/64 | HR 82 | Temp 99.3°F | Resp 18 | Ht 63.0 in | Wt 178.8 lb

## 2016-07-08 DIAGNOSIS — R059 Cough, unspecified: Secondary | ICD-10-CM

## 2016-07-08 DIAGNOSIS — R05 Cough: Secondary | ICD-10-CM

## 2016-07-08 DIAGNOSIS — J069 Acute upper respiratory infection, unspecified: Secondary | ICD-10-CM

## 2016-07-08 DIAGNOSIS — R062 Wheezing: Secondary | ICD-10-CM | POA: Diagnosis not present

## 2016-07-08 MED ORDER — AZITHROMYCIN 250 MG PO TABS: ORAL_TABLET | ORAL | 0 refills | Status: DC

## 2016-07-08 NOTE — Patient Instructions (Addendum)
It was good to meet you today!  Take the azithromycin 2 tabs today and 1 tab daily after that.  Use the albuterol inhaler every 4-6 hours for the next several days. Also sees it before you possibly night.  You should start feeling better through the weekend. If you do not feel by next week come back and see us.    IF you received an x-ray today, you will [facility]. Please contact [facility] at [phone] with questions or concerns regarding your invoice.   IF you received labwork today, you will receive an invoice from [facility]. Please contact [facility] at [phone] with questions or concerns regarding your invoice.   Our billing staff will not be able to assist you with questions regarding bills from these companies.  You will be contacted with the lab results as soon as they are available. The fastest way to get your results is to activate your My Chart account. Instructions are located on the last page of this paperwork. If you have not heard from us regarding the results in 2 weeks, please contact this office.

## 2016-07-08 NOTE — Progress Notes (Signed)
Mckenzie Vasquez is a 65 y.o. female who presents to [facility] today for URI symptoms:  1.  SUBJECTIVE: URI symptoms:  Mckenzie Vasquez is a 65 y.o. female who complains of URI symptoms present for past 2 weeks.  Describes rhinorrhea, sinus congestion, persistent cough keeping her up at night.  Has tried Mucinex without relief.  Sick contacts are school children.  Subjective chills. No nausea or vomiting.  Denies smoking cigarettes.   ROS as above.    PMH reviewed. Patient is a nonsmoker.   Past Medical History:  Diagnosis Date  . Arthritis   . Asthma   . Diverticulitis large intestine 2012   pt. states this is ongoing and sometimes worse than others  . GERD (gastroesophageal reflux disease)   . Hypertension    Past Surgical History:  Procedure Laterality Date  . BREAST BIOPSY     left  . CHOLECYSTECTOMY    . COLONOSCOPY    . ESOPHAGOGASTRODUODENOSCOPY    . EXPLORATORY LAPAROTOMY     x2; oophorectomyx1  . LAPAROSCOPIC ENDOMETRIOSIS FULGURATION  1997   x3  . LAPAROTOMY  1997  . OOPHORECTOMY    . TONSILLECTOMY      Medications reviewed. Current Outpatient Prescriptions  Medication Sig Dispense Refill  . albuterol (PROAIR HFA) 108 (90 BASE) MCG/ACT inhaler Inhale 2 puffs into the lungs every 4 (four) hours as needed. 1 Inhaler 12  . Estradiol (VAGIFEM VA) Place 1 application vaginally daily.    . hydrocortisone (ANUCORT-HC) 25 MG suppository Place 1 suppository (25 mg total) rectally 2 (two) times daily. PATIENT NEEDS OFFICE VISIT FOR ADDITIONAL REFILLS 28 suppository 0  . hydrocortisone (ANUSOL-HC) 25 MG suppository Place 1 suppository (25 mg total) rectally 2 (two) times daily. 28 suppository 2  . HYDROCORTISONE ACE, RECTAL, 30 MG SUPP Place 1 suppository (30 mg total) rectally 2 (two) times daily. 28 each 2  . lisinopril-hydrochlorothiazide (PRINZIDE,ZESTORETIC) 10-12.5 MG per tablet TAKE 1 TABLET ONCE DAILY. 30 tablet 2  . meloxicam (MOBIC) 15 MG  tablet Take 1 tablet (15 mg total) by mouth daily. 30 tablet 0  . OPTIVAR 0.05 % ophthalmic solution INSTILL 1 DROP IN EACH EYE TWICE DAILY. 6 mL 0  . PROCTOZONE-HC 2.5 % rectal cream APPLY RECTALLY TWICE DAILY AS DIRECTED. 30 g 3  . cyclobenzaprine (FLEXERIL) 10 MG tablet Take 1 tablet (10 mg total) by mouth at bedtime. (Patient not taking: Reported on 07/08/2016) 30 tablet 3   No current facility-administered medications for this visit.      Physical Exam:  BP 108/64   Pulse 82   Temp 99.3 F (37.4 C) (Oral)   Resp 18   Ht 5\' 3"  (1.6 m)   Wt 178 lb 12.8 oz (81.1 kg)   SpO2 98%   BMI 31.67 kg/m  Gen:  Patient sitting on exam table, appears stated age in no acute distress Head: Normocephalic atraumatic Eyes: EOMI, PERRL, sclera and conjunctiva non-erythematous Ears:  Canals clear bilaterally.  TMs pearly gray bilaterally without erythema or bulging.   Nose:  Nasal turbinates grossly enlarged bilaterally. Some exudates noted. Tender to palpation of maxillary sinus  Mouth: Mucosa membranes moist. Tonsils +2, nonenlarged, non-erythematous. Neck: No cervical lymphadenopathy noted Heart:  RRR, no murmurs auscultated. Pulm:  Minimal wheezing at bases.     Assessment and Plan:  1.  URI. Plan to treat due to length of symptoms and no improvement.   Will prescribe azithromycin x 5 days. Instructed patient  to return in 1 week for checkup or sooner if worsening or no improvement.   2.  Wheezing:   - with persistent dry cough at night.  - albuterol for relief today - FU in 4 - 6 weeks once this clears for PFTs to test for asthma.

## 2016-07-19 ENCOUNTER — Telehealth: Payer: Self-pay | Admitting: Internal Medicine

## 2016-07-19 ENCOUNTER — Ambulatory Visit (INDEPENDENT_AMBULATORY_CARE_PROVIDER_SITE_OTHER): Payer: BLUE CROSS/BLUE SHIELD | Admitting: Family Medicine

## 2016-07-19 VITALS — BP 122/72 | HR 82 | Temp 98.6°F | Resp 17 | Ht 63.5 in | Wt 181.0 lb

## 2016-07-19 DIAGNOSIS — K5641 Fecal impaction: Secondary | ICD-10-CM

## 2016-07-19 MED ORDER — FLEET ENEMA 7-19 GM/118ML RE ENEM: 1.0000 | ENEMA | Freq: Every day | RECTAL | 1 refills | Status: DC | PRN

## 2016-07-19 NOTE — Progress Notes (Signed)
Chief Complaint  Patient presents with  . Hemorrhoids    HPI Pt reports that she thought she was constipated She tried 9 colace and 3 laxatives in 24 hours She reports that she took dulcolax generic from [facility].   She is not drinking water since early afternoon yesterday She reports that she eats a lot of fiber She reports that her last bowel movement was Friday which was 3 days ago. Her bowel movement were normal caliber.  She reports that she had an upper respiratory infection a week ago but was having normal BM during that episode.  She reports that she has been feeling constipated and having gas.     Past Medical History:  Diagnosis Date  . Arthritis   . Asthma   . Diverticulitis large intestine 2012   pt. states this is ongoing and sometimes worse than others  . GERD (gastroesophageal reflux disease)   . Hypertension     Current Outpatient Prescriptions  Medication Sig Dispense Refill  . albuterol (PROAIR HFA) 108 (90 BASE) MCG/ACT inhaler Inhale 2 puffs into the lungs every 4 (four) hours as needed. 1 Inhaler 12  . Estradiol (VAGIFEM VA) Place 1 application vaginally daily.    . hydrocortisone (ANUCORT-HC) 25 MG suppository Place 1 suppository (25 mg total) rectally 2 (two) times daily. PATIENT NEEDS OFFICE VISIT FOR ADDITIONAL REFILLS 28 suppository 0  . hydrocortisone (ANUSOL-HC) 25 MG suppository Place 1 suppository (25 mg total) rectally 2 (two) times daily. 28 suppository 2  . HYDROCORTISONE ACE, RECTAL, 30 MG SUPP Place 1 suppository (30 mg total) rectally 2 (two) times daily. 28 each 2  . lisinopril-hydrochlorothiazide (PRINZIDE,ZESTORETIC) 10-12.5 MG per tablet TAKE 1 TABLET ONCE DAILY. 30 tablet 2  . meloxicam (MOBIC) 15 MG tablet Take 1 tablet (15 mg total) by mouth daily. 30 tablet 0  . OPTIVAR 0.05 % ophthalmic solution INSTILL 1 DROP IN EACH EYE TWICE DAILY. 6 mL 0  . PROCTOZONE-HC 2.5 % rectal cream APPLY RECTALLY TWICE DAILY AS DIRECTED. 30 g 3  .  cyclobenzaprine (FLEXERIL) 10 MG tablet Take 1 tablet (10 mg total) by mouth at bedtime. (Patient not taking: Reported on 07/19/2016) 30 tablet 3  . sodium phosphate (FLEET) 7-19 GM/118ML ENEM Place 133 mLs (1 enema total) rectally daily as needed for severe constipation. 2 enema 1   No current facility-administered medications for this visit.     Allergies:  Allergies  Allergen Reactions  . Sulfa Antibiotics Rash    Past Surgical History:  Procedure Laterality Date  . BREAST BIOPSY     left  . CHOLECYSTECTOMY    . COLONOSCOPY    . ESOPHAGOGASTRODUODENOSCOPY    . EXPLORATORY LAPAROTOMY     x2; oophorectomyx1  . LAPAROSCOPIC ENDOMETRIOSIS FULGURATION  1997   x3  . LAPAROTOMY  1997  . OOPHORECTOMY    . TONSILLECTOMY      Social History   Social History  . Marital status: Married    Spouse name: N/A  . Number of children: 2  . Years of education: PhD   Social History Main Topics  . Smoking status: Never Smoker  . Smokeless tobacco: Never Used  . Alcohol use No  . Drug use: No  . Sexual activity: Yes   Other Topics Concern  . None   Social History Narrative   3-4 sodas a day     Review of Systems  Constitutional: Negative for chills, fever, malaise/fatigue and weight loss.  Respiratory: Negative for cough and  wheezing.   Gastrointestinal: Positive for constipation. Negative for abdominal pain, diarrhea, nausea and vomiting.  Genitourinary: Negative for dysuria and frequency.  Neurological: Negative for dizziness, tremors and focal weakness.    Objective: Vitals:   07/19/16 0842  BP: 122/72  Pulse: 82  Resp: 17  Temp: 98.6 F (37 C)  TempSrc: Oral  SpO2: 97%  Weight: 181 lb (82.1 kg)  Height: 5' 3.5" (1.613 m)    Physical Exam  Constitutional: She is oriented to person, place, and time. She appears well-developed and well-nourished.  HENT:  Head: Normocephalic and atraumatic.  Cardiovascular: Normal rate, regular rhythm and normal heart sounds.     Pulmonary/Chest: Effort normal and breath sounds normal. No respiratory distress.  Abdominal: Soft. Bowel sounds are normal. She exhibits no distension. There is no tenderness.  Genitourinary:  Genitourinary Comments: Rectal exam: normal sphincter tone, no visible hemorrhoids, large stool in vault  Neurological: She is alert and oriented to person, place, and time.    Assessment and Plan Nicholos JohnsKathleen was seen today for hemorrhoids.  Diagnoses and all orders for this visit:  Fecal impaction (HCC)  Advised that she should never take that many colace in a day Spent time discussing fiber in foods, the importance of hydration, fiber supplementation and the different classes of laxatives After rectal exam educated pt on how to use fleets enema at home Discussed the time course expected Discussed dietary changes going forward  -     sodium phosphate (FLEET) 7-19 GM/118ML ENEM; Place 133 mLs (1 enema total) rectally daily as needed for severe constipation.  A total of 30 minutes were spent face-to-face with the patient during this encounter and over half of that time was spent on counseling and coordination of care.    Olie Dibert A Kalmen Lollar

## 2016-07-19 NOTE — Patient Instructions (Addendum)
IF you received an x-ray today, you will receive an invoice from [facility]. Please contact [facility] at [phone] with questions or concerns regarding your invoice.   IF you received labwork today, you will receive an invoice from [facility]. Please contact [facility] at [phone] with questions or concerns regarding your invoice.   Our billing staff will not be able to assist you with questions regarding bills from these companies.  You will be contacted with the lab results as soon as they are available. The fastest way to get your results is to activate your My Chart account. Instructions are located on the last page of this paperwork. If you have not heard from us regarding the results in 2 weeks, please contact this office.     High-Fiber Diet Fiber, also called dietary fiber, is a type of carbohydrate found in fruits, vegetables, whole grains, and beans. A high-fiber diet can have many health benefits. Your health care provider may recommend a high-fiber diet to help:  Prevent constipation. Fiber can make your bowel movements more regular.  Lower your cholesterol.  Relieve hemorrhoids, uncomplicated diverticulosis, or irritable bowel syndrome.  Prevent overeating as part of a weight-loss plan.  Prevent heart disease, type 2 diabetes, and certain cancers. WHAT IS MY PLAN? The recommended daily intake of fiber includes:  38 grams for men under age 63.  30 grams for men over age 150.  25 grams for women under age 63.  21 grams for women over age 63. You can get the recommended daily intake of dietary fiber by eating a variety of fruits, vegetables, grains, and beans. Your health care provider may also recommend a fiber supplement if it is not possible to get enough fiber through your diet. WHAT DO I NEED TO KNOW ABOUT A HIGH-FIBER DIET?  Fiber supplements have not been widely studied for their effectiveness, so it is  better to get fiber through food sources.  Always check the fiber content on thenutrition facts label of any prepackaged food. Look for foods that contain at least 5 grams of fiber per serving.  Ask your dietitian if you have questions about specific foods that are related to your condition, especially if those foods are not listed in the following section.  Increase your daily fiber consumption gradually. Increasing your intake of dietary fiber too quickly may cause bloating, cramping, or gas.  Drink plenty of water. Water helps you to digest fiber. WHAT FOODS CAN I EAT? Grains Whole-grain breads. Multigrain cereal. Oats and oatmeal. Brown rice. Barley. Bulgur wheat. Millet. Bran muffins. Popcorn. Rye wafer crackers. Vegetables Sweet potatoes. Spinach. Kale. Artichokes. Cabbage. Broccoli. Green peas. Carrots. Squash. Fruits Berries. Pears. Apples. Oranges. Avocados. Prunes and raisins. Dried figs. Meats and Other Protein Sources Navy, kidney, pinto, and soy beans. Split peas. Lentils. Nuts and seeds. Dairy Fiber-fortified yogurt. Beverages Fiber-fortified soy milk. Fiber-fortified orange juice. Other Fiber bars. The items listed above may not be a complete list of recommended foods or beverages. Contact your dietitian for more options. WHAT FOODS ARE NOT RECOMMENDED? Grains White bread. Pasta made with refined flour. White rice. Vegetables Fried potatoes. Canned vegetables. Well-cooked vegetables.  Fruits Fruit juice. Cooked, strained fruit. Meats and Other Protein Sources Fatty cuts of meat. Fried Environmental education officerpoultry or fried fish. Dairy Milk. Yogurt. Cream cheese. Sour cream. Beverages Soft drinks. Other Cakes and pastries. Butter and oils. The items listed above may not be a complete list of foods and beverages to avoid. Contact  your dietitian for more information. WHAT ARE SOME TIPS FOR INCLUDING HIGH-FIBER FOODS IN MY DIET?  Eat a wide variety of high-fiber foods.  Make sure  that half of all grains consumed each day are whole grains.  Replace breads and cereals made from refined flour or white flour with whole-grain breads and cereals.  Replace white rice with brown rice, bulgur wheat, or millet.  Start the day with a breakfast that is high in fiber, such as a cereal that contains at least 5 grams of fiber per serving.  Use beans in place of meat in soups, salads, or pasta.  Eat high-fiber snacks, such as berries, raw vegetables, nuts, or popcorn.   This information is not intended to replace advice given to you by your health care provider. Make sure you discuss any questions you have with your health care provider.   Document Released: 08/30/2005 Document Revised: 09/20/2014 Document Reviewed: 02/12/2014 Elsevier Interactive Patient Education [employer]2016 Elsevier Inc.

## 2016-07-19 NOTE — Telephone Encounter (Signed)
Patient reports that she went to Urgent care this am and problem has been taken care of.

## 2016-07-30 ENCOUNTER — Ambulatory Visit
Admission: RE | Admit: 2016-07-30 | Discharge: 2016-07-30 | Disposition: A | Discharge status: Home / Self Care | Payer: BLUE CROSS/BLUE SHIELD | Source: Ambulatory Visit | Attending: Obstetrics and Gynecology | Admitting: Obstetrics and Gynecology

## 2016-07-30 DIAGNOSIS — Z1231 Encounter for screening mammogram for malignant neoplasm of breast: Secondary | ICD-10-CM

## 2016-12-10 ENCOUNTER — Ambulatory Visit (INDEPENDENT_AMBULATORY_CARE_PROVIDER_SITE_OTHER): Payer: BLUE CROSS/BLUE SHIELD | Admitting: Physician Assistant

## 2016-12-10 VITALS — BP 112/73 | HR 87 | Temp 98.1°F | Resp 18 | Ht 63.5 in | Wt 172.0 lb

## 2016-12-10 DIAGNOSIS — Z7189 Other specified counseling: Secondary | ICD-10-CM | POA: Diagnosis not present

## 2016-12-10 DIAGNOSIS — Z23 Encounter for immunization: Secondary | ICD-10-CM | POA: Diagnosis not present

## 2016-12-10 DIAGNOSIS — Z7184 Encounter for health counseling related to travel: Secondary | ICD-10-CM

## 2016-12-10 MED ORDER — CIPROFLOXACIN HCL 500 MG PO TABS: 500.0000 mg | ORAL_TABLET | Freq: Two times a day (BID) | ORAL | 0 refills | Status: DC

## 2016-12-10 MED ORDER — DOXYCYCLINE HYCLATE 100 MG PO CAPS: 100.0000 mg | ORAL_CAPSULE | Freq: Every day | ORAL | 0 refills | Status: DC

## 2016-12-10 MED ORDER — TYPHOID VACCINE PO CPDR: 1.0000 | DELAYED_RELEASE_CAPSULE | ORAL | 0 refills | Status: DC

## 2016-12-10 NOTE — Patient Instructions (Addendum)
Try to complete the typhoid vaccine (Vivotif) before you start the Doxycycline.    IF you received an x-ray today, you will receive an invoice from [facility]. Please contact [facility] at [phone] with questions or concerns regarding your invoice.   IF you received labwork today, you will receive an invoice from [facility]. Please contact [facility] at [phone] with questions or concerns regarding your invoice.   Our billing staff will not be able to assist you with questions regarding bills from these companies.  You will be contacted with the lab results as soon as they are available. The fastest way to get your results is to activate your My Chart account. Instructions are located on the last page of this paperwork. If you have not heard from [place] regarding the results in 2 weeks, please contact this office.

## 2016-12-10 NOTE — Progress Notes (Signed)
Patient ID: Mckenzie Vasquez, female    DOB: 07/08/1951, 66 y.o.   MRN: [id number]  PCP: Londell Moh, MD  Chief Complaint  Patient presents with  . Immunizations    Hep A & Hep B, Tetnas, Cipro or antibiotic.    Subjective:   Presents for travel counseling and immunizations.  Participating in a mission trip to [place].  Tdap in 2015. Hep A #1 in 2015. No record or recollection of Hep B vaccination.  Has been advised to obtain Cipro for PRN use, and malaria and typhoid vaccines.  Review of Systems  Constitutional: Negative.   Respiratory: Negative.   Neurological: Negative.       Patient Active Problem List   Diagnosis Date Noted  . Pain, joint, multiple sites 10/24/2013  . Sleep apnea 10/24/2013  . BMI 38.0-38.9,adult 11/07/2012  . Intrinsic asthma 11/07/2012  . OBESITY 10/25/2007  . HYPERTENSION, BENIGN 10/25/2007  . GERD 10/25/2007  . HIATAL HERNIA 10/25/2007  . INTERSTITIAL CYSTITIS 10/25/2007  . ENDOMETRIOSIS 10/25/2007  . FIBROCYSTIC BREAST DISEASE, HX OF 10/25/2007     Prior to Admission medications   Medication Sig Start Date End Date Taking? Authorizing Provider  albuterol (PROAIR HFA) 108 (90 BASE) MCG/ACT inhaler Inhale 2 puffs into the lungs every 4 (four) hours as needed. 01/21/14  Yes Carmelina Dane, MD  Estradiol [facility]) Place 1 application vaginally daily.   Yes Historical Provider, MD  hydrocortisone (ANUCORT-HC) 25 MG suppository Place 1 suppository (25 mg total) rectally 2 (two) times daily. PATIENT NEEDS OFFICE VISIT FOR ADDITIONAL REFILLS 07/08/14  Yes Tonye Pearson, MD  hydrocortisone (ANUSOL-HC) 25 MG suppository Place 1 suppository (25 mg total) rectally 2 (two) times daily. 09/12/14  Yes Tonye Pearson, MD  HYDROCORTISONE ACE, RECTAL, 30 MG SUPP Place 1 suppository (30 mg total) rectally 2 (two) times daily. 05/05/12  Yes Thao P Le, DO  lisinopril-hydrochlorothiazide (PRINZIDE,ZESTORETIC) 10-12.5 MG per tablet  TAKE 1 TABLET ONCE DAILY. 11/10/14  Yes Tonye Pearson, MD  meloxicam (MOBIC) 15 MG tablet Take 1 tablet (15 mg total) by mouth daily. 05/07/16  Yes Tanaiya Kolarik, PA-C  OPTIVAR 0.05 % ophthalmic solution INSTILL 1 DROP IN EACH EYE TWICE DAILY. 10/25/11  Yes Ryan M Dunn, PA-C  PROCTOZONE-HC 2.5 % rectal cream APPLY RECTALLY TWICE DAILY AS DIRECTED. 01/02/14  Yes Tonye Pearson, MD  sodium phosphate (FLEET) 7-19 GM/118ML ENEM Place 133 mLs (1 enema total) rectally daily as needed for severe constipation. 07/19/16  Yes Zoe A Creta Levin, MD  cyclobenzaprine (FLEXERIL) 10 MG tablet Take 1 tablet (10 mg total) by mouth at bedtime. Patient not taking: Reported on 07/19/2016 08/06/14   Tonye Pearson, MD     Allergies  Allergen Reactions  . Sulfa Antibiotics Rash       Objective:  Physical Exam  Constitutional: She is oriented to person, place, and time. She appears well-developed and well-nourished. She is active and cooperative. No distress.  BP 112/73   Pulse 87   Temp 98.1 F (36.7 C) (Oral)   Resp 18   Ht 5' 3.5" (1.613 m)   Wt 172 lb (78 kg)   SpO2 96%   BMI 29.99 kg/m    Eyes: Conjunctivae are normal.  Pulmonary/Chest: Effort normal.  Neurological: She is alert and oriented to person, place, and time.  Psychiatric: She has a normal mood and affect. Her speech is normal and behavior is normal.  Assessment & Plan:   1. Travel advice encounter Anticipatory guidance. - ciprofloxacin (CIPRO) 500 MG tablet; Take 1 tablet (500 mg total) by mouth 2 (two) times daily.  Dispense: 10 tablet; Refill: 0 - doxycycline (VIBRAMYCIN) 100 MG capsule; Take 1 capsule (100 mg total) by mouth daily. Start 1-2 days prior to travel and continue 4 weeks upon return  Dispense: 50 capsule; Refill: 0 - typhoid (VIVOTIF) DR capsule; Take 1 capsule by mouth every other day. X 4 doses.  Dispense: 4 capsule; Refill: 0  2. Need for hepatitis A and B vaccination RTC in 1 month for Hep  B dose #2. - Hepatitis A vaccine adult IM - Hepatitis B vaccine adult IM   Fernande Bras, PA-C Physician Assistant-Certified Primary Care at [facility]

## 2017-01-04 ENCOUNTER — Ambulatory Visit (INDEPENDENT_AMBULATORY_CARE_PROVIDER_SITE_OTHER): Payer: BLUE CROSS/BLUE SHIELD | Admitting: Physician Assistant

## 2017-01-04 VITALS — BP 121/80 | HR 92 | Temp 100.5°F | Resp 18 | Ht 62.21 in | Wt 172.6 lb

## 2017-01-04 DIAGNOSIS — R509 Fever, unspecified: Secondary | ICD-10-CM

## 2017-01-04 DIAGNOSIS — J101 Influenza due to other identified influenza virus with other respiratory manifestations: Secondary | ICD-10-CM

## 2017-01-04 LAB — POCT INFLUENZA A/B
INFLUENZA B, POC: POSITIVE — AB
Influenza A, POC: NEGATIVE

## 2017-01-04 MED ORDER — OSELTAMIVIR PHOSPHATE 75 MG PO CAPS: 75.0000 mg | ORAL_CAPSULE | Freq: Two times a day (BID) | ORAL | 0 refills | Status: DC

## 2017-01-04 MED ORDER — GUAIFENESIN ER 1200 MG PO TB12
1.0000 | ORAL_TABLET | Freq: Two times a day (BID) | ORAL | 1 refills | Status: DC | PRN
Start: 2017-01-04 — End: 2017-05-31

## 2017-01-04 NOTE — Progress Notes (Signed)
PRIMARY CARE AT [facility] [address] [place] [address] [phone]  Date:  01/04/2017   Name:  Mckenzie Vasquez   DOB:  1951/01/28   MRN:  [id number]  PCP:  Londell Moh, MD    History of Present Illness:  Mckenzie Vasquez is a 66 y.o. female patient who presents to PCP with  Chief Complaint  Patient presents with  . Cough    X 3 days   . Nasal Congestion    X 3 days   . Generalized Body Aches    X 2 days     Scratchy sore throat, nasal congestion, sneezing.  She has bodyaches.  She has left ear pain.    Woke up with fever.  Questionable flu vs bronchitis of coworker last.   Teeth hurts, and sinus pain Dayquil and nyquil taken afrin.   She feels that she is having to take short breaths due to cough.  No dyspnea with exertion.   She denies abdominal pain.  Patient Active Problem List   Diagnosis Date Noted  . Pain, joint, multiple sites 10/24/2013  . Sleep apnea 10/24/2013  . BMI 38.0-38.9,adult 11/07/2012  . Intrinsic asthma 11/07/2012  . OBESITY 10/25/2007  . HYPERTENSION, BENIGN 10/25/2007  . GERD 10/25/2007  . HIATAL HERNIA 10/25/2007  . INTERSTITIAL CYSTITIS 10/25/2007  . ENDOMETRIOSIS 10/25/2007  . FIBROCYSTIC BREAST DISEASE, HX OF 10/25/2007    Past Medical History:  Diagnosis Date  . Arthritis   . Asthma   . Diverticulitis large intestine 2012   pt. states this is ongoing and sometimes worse than others  . GERD (gastroesophageal reflux disease)   . Hypertension     Past Surgical History:  Procedure Laterality Date  . BREAST BIOPSY     left  . CHOLECYSTECTOMY    . COLONOSCOPY    . ESOPHAGOGASTRODUODENOSCOPY    . EXPLORATORY LAPAROTOMY     x2; oophorectomyx1  . LAPAROSCOPIC ENDOMETRIOSIS FULGURATION  1997   x3  . LAPAROTOMY  1997  . OOPHORECTOMY    . TONSILLECTOMY      Social History  Substance Use Topics  . Smoking status: Never Smoker  . Smokeless tobacco: Never Used  . Alcohol use No    Family History  Problem Relation  Age of Onset  . Colon cancer Maternal Grandfather   . Colon cancer Other   . Colon cancer Paternal Grandfather   . Stroke Paternal Grandfather   . Alzheimer's disease Mother   . COPD Father   . Stroke Father   . Anxiety disorder Daughter     Allergies  Allergen Reactions  . Sulfa Antibiotics Rash    Medication list has been reviewed and updated.  Current Outpatient Prescriptions on File Prior to Visit  Medication Sig Dispense Refill  . albuterol (PROAIR HFA) 108 (90 BASE) MCG/ACT inhaler Inhale 2 puffs into the lungs every 4 (four) hours as needed. 1 Inhaler 12  . ciprofloxacin (CIPRO) 500 MG tablet Take 1 tablet (500 mg total) by mouth 2 (two) times daily. 10 tablet 0  . doxycycline (VIBRAMYCIN) 100 MG capsule Take 1 capsule (100 mg total) by mouth daily. Start 1-2 days prior to travel and continue 4 weeks upon return 50 capsule 0  . Estradiol (VAGIFEM VA) Place 1 application vaginally daily.    . hydrocortisone (ANUCORT-HC) 25 MG suppository Place 1 suppository (25 mg total) rectally 2 (two) times daily. PATIENT NEEDS OFFICE VISIT FOR ADDITIONAL REFILLS 28 suppository 0  . hydrocortisone (ANUSOL-HC) 25  MG suppository Place 1 suppository (25 mg total) rectally 2 (two) times daily. 28 suppository 2  . HYDROCORTISONE ACE, RECTAL, 30 MG SUPP Place 1 suppository (30 mg total) rectally 2 (two) times daily. 28 each 2  . lisinopril-hydrochlorothiazide (PRINZIDE,ZESTORETIC) 10-12.5 MG per tablet TAKE 1 TABLET ONCE DAILY. 30 tablet 2  . meloxicam (MOBIC) 15 MG tablet Take 1 tablet (15 mg total) by mouth daily. 30 tablet 0  . OPTIVAR 0.05 % ophthalmic solution INSTILL 1 DROP IN EACH EYE TWICE DAILY. 6 mL 0  . PROCTOZONE-HC 2.5 % rectal cream APPLY RECTALLY TWICE DAILY AS DIRECTED. 30 g 3  . sodium phosphate (FLEET) 7-19 GM/118ML ENEM Place 133 mLs (1 enema total) rectally daily as needed for severe constipation. 2 enema 1  . typhoid (VIVOTIF) DR capsule Take 1 capsule by mouth every other day.  X 4 doses. 4 capsule 0  . cyclobenzaprine (FLEXERIL) 10 MG tablet Take 1 tablet (10 mg total) by mouth at bedtime. (Patient not taking: Reported on 07/19/2016) 30 tablet 3   No current facility-administered medications on file prior to visit.     ROS ROS otherwise unremarkable unless listed above.  Physical Examination: BP 121/80 (BP Location: Right Arm, Patient Position: Sitting, Cuff Size: Small)   Pulse 92   Temp (!) 100.5 F (38.1 C) (Oral)   Resp 18   Ht 5' 2.21" (1.58 m)   Wt 172 lb 9.6 oz (78.3 kg)   SpO2 99%   BMI 31.36 kg/m  Ideal Body Weight: Weight in (lb) to have BMI = 25: 137.3  Physical Exam  Constitutional: She is oriented to person, place, and time. She appears well-developed and well-nourished. No distress.  HENT:  Head: Normocephalic and atraumatic.  Right Ear: Tympanic membrane, external ear and ear canal normal.  Left Ear: Tympanic membrane, external ear and ear canal normal.  Nose: Mucosal edema and rhinorrhea present. Right sinus exhibits no maxillary sinus tenderness and no frontal sinus tenderness. Left sinus exhibits no maxillary sinus tenderness and no frontal sinus tenderness.  Mouth/Throat: No uvula swelling. No oropharyngeal exudate, posterior oropharyngeal edema or posterior oropharyngeal erythema.  Eyes: Conjunctivae and EOM are normal. Pupils are equal, round, and reactive to light.  Cardiovascular: Normal rate and regular rhythm.  Exam reveals no gallop, no distant heart sounds and no friction rub.   No murmur heard. Pulmonary/Chest: Effort normal. No respiratory distress. She has no decreased breath sounds. She has no wheezes. She has no rhonchi.  Lymphadenopathy:       Head (right side): No submandibular, no tonsillar, no preauricular and no posterior auricular adenopathy present.       Head (left side): No submandibular, no tonsillar, no preauricular and no posterior auricular adenopathy present.  Neurological: She is alert and oriented to  person, place, and time.  Skin: She is not diaphoretic.  Psychiatric: She has a normal mood and affect. Her behavior is normal.   Results for orders placed or performed in visit on 01/04/17  POCT Influenza A/B  Result Value Ref Range   Influenza A, POC Negative Negative   Influenza B, POC Positive (A) Negative     Assessment and Plan: CYDNEY ALVARENGA is a 66 y.o. female who is here today for cc of bodyaches,congestion, cough. Influenza B - Plan: oseltamivir (TAMIFLU) 75 MG capsule, Guaifenesin (MUCINEX MAXIMUM STRENGTH) 1200 MG TB12  Fever, unspecified - Plan: POCT Influenza A/B  Trena Platt, PA-C [facility] 4/26/20186:32 AM

## 2017-01-04 NOTE — Patient Instructions (Addendum)
Continue swapping tylenol and ibuprofen for pain and fever. Take medication as prescribed.   Influenza, Adult Influenza ("the flu") is an infection in the lungs, nose, and throat (respiratory tract). It is caused by a virus. The flu causes many common cold symptoms, as well as a high fever and body aches. It can make you feel very sick. The flu spreads easily from person to person (is contagious). Getting a flu shot (influenza vaccination) every year is the best way to prevent the flu. Follow these instructions at home:  Take over-the-counter and prescription medicines only as told by your doctor.  Use a cool mist humidifier to add moisture (humidity) to the air in your home. This can make it easier to breathe.  Rest as needed.  Drink enough fluid to keep your pee (urine) clear or pale yellow.  Cover your mouth and nose when you cough or sneeze.  Wash your hands with soap and water often, especially after you cough or sneeze. If you cannot use soap and water, use hand sanitizer.  Stay home from work or school as told by your doctor. Unless you are visiting your doctor, try to avoid leaving home until your fever has been gone for 24 hours without the use of medicine.  Keep all follow-up visits as told by your doctor. This is important. How is this prevented?  Getting a yearly (annual) flu shot is the best way to avoid getting the flu. You may get the flu shot in late summer, fall, or winter. Ask your doctor when you should get your flu shot.  Wash your hands often or use hand sanitizer often.  Avoid contact with people who are sick during cold and flu season.  Eat healthy foods.  Drink plenty of fluids.  Get enough sleep.  Exercise regularly. Contact a doctor if:  You get new symptoms.  You have:  Chest pain.  Watery poop (diarrhea).  A fever.  Your cough gets worse.  You start to have more mucus.  You feel sick to your stomach (nauseous).  You throw up  (vomit). Get help right away if:  You start to be short of breath or have trouble breathing.  Your skin or nails turn a bluish color.  You have very bad pain or stiffness in your neck.  You get a sudden headache.  You get sudden pain in your face or ear.  You cannot stop throwing up. This information is not intended to replace advice given to you by your health care provider. Make sure you discuss any questions you have with your health care provider. Document Released: 06/08/2008 Document Revised: 02/05/2016 Document Reviewed: 06/24/2015 Elsevier Interactive Patient Education  2017 [employer].     IF you received an x-ray today, you will receive an invoice from [facility]. Please contact [facility] at [phone] with questions or concerns regarding your invoice.   IF you received labwork today, you will receive an invoice from [facility]. Please contact [facility] at [phone] with questions or concerns regarding your invoice.   Our billing staff will not be able to assist you with questions regarding bills from these companies.  You will be contacted with the lab results as soon as they are available. The fastest way to get your results is to activate your My Chart account. Instructions are located on the last page of this paperwork. If you have not heard from [place] regarding the results in 2 weeks, please contact this office.

## 2017-01-14 ENCOUNTER — Ambulatory Visit (INDEPENDENT_AMBULATORY_CARE_PROVIDER_SITE_OTHER): Payer: BLUE CROSS/BLUE SHIELD | Admitting: Physician Assistant

## 2017-01-14 DIAGNOSIS — Z23 Encounter for immunization: Secondary | ICD-10-CM

## 2017-01-14 DIAGNOSIS — Z299 Encounter for prophylactic measures, unspecified: Secondary | ICD-10-CM

## 2017-01-14 NOTE — Progress Notes (Signed)
Feeling well, here for 2nd hep b

## 2017-05-02 ENCOUNTER — Ambulatory Visit (INDEPENDENT_AMBULATORY_CARE_PROVIDER_SITE_OTHER): Payer: BLUE CROSS/BLUE SHIELD | Admitting: Emergency Medicine

## 2017-05-02 ENCOUNTER — Encounter: Payer: Self-pay | Admitting: Emergency Medicine

## 2017-05-02 VITALS — BP 117/77 | HR 83 | Temp 99.1°F | Resp 18 | Ht 62.4 in | Wt 173.8 lb

## 2017-05-02 DIAGNOSIS — Z8709 Personal history of other diseases of the respiratory system: Secondary | ICD-10-CM

## 2017-05-02 DIAGNOSIS — J209 Acute bronchitis, unspecified: Secondary | ICD-10-CM | POA: Diagnosis not present

## 2017-05-02 DIAGNOSIS — R05 Cough: Secondary | ICD-10-CM | POA: Diagnosis not present

## 2017-05-02 DIAGNOSIS — R059 Cough, unspecified: Secondary | ICD-10-CM

## 2017-05-02 DIAGNOSIS — R509 Fever, unspecified: Secondary | ICD-10-CM

## 2017-05-02 MED ORDER — PROMETHAZINE-CODEINE 6.25-10 MG/5ML PO SYRP: 5.0000 mL | ORAL_SOLUTION | Freq: Every evening | ORAL | 0 refills | Status: DC | PRN

## 2017-05-02 MED ORDER — AZITHROMYCIN 250 MG PO TABS: ORAL_TABLET | ORAL | 0 refills | Status: DC

## 2017-05-02 MED ORDER — PREDNISONE 20 MG PO TABS: 40.0000 mg | ORAL_TABLET | Freq: Every day | ORAL | 0 refills | Status: AC

## 2017-05-02 NOTE — Progress Notes (Signed)
Mckenzie Vasquez 66 y.o.   Chief Complaint  Patient presents with  . Headache    X 2-3 days- with some fever  . Chills    X 2-3 days- with some SOB  . Generalized Body Aches    X 2-3 days- with cough    HISTORY OF PRESENT ILLNESS: This is a 66 y.o. female complaining of flu-like symptoms x 3 days; has h/o asthma.  URI   This is a new problem. The current episode started in the past 7 days. The problem has been gradually worsening. The maximum temperature recorded prior to her arrival was 101 - 101.9 F. The fever has been present for less than 1 day. Associated symptoms include congestion, coughing, a sore throat (gone now) and wheezing. Pertinent negatives include no abdominal pain, chest pain, diarrhea, dysuria, ear pain, headaches, joint pain, joint swelling, nausea, neck pain, rash, swollen glands or vomiting. She has tried inhaler use for the symptoms.     Prior to Admission medications   Medication Sig Start Date End Date Taking? Authorizing Provider  albuterol (PROAIR HFA) 108 (90 BASE) MCG/ACT inhaler Inhale 2 puffs into the lungs every 4 (four) hours as needed. 01/21/14  Yes Carmelina Dane, MD  ciprofloxacin (CIPRO) 500 MG tablet Take 1 tablet (500 mg total) by mouth 2 (two) times daily. 12/10/16  Yes Jeffery, Chelle, PA-C  Estradiol (VAGIFEM VA) Place 1 application vaginally daily.   Yes [provider]  hydrocortisone (ANUCORT-HC) 25 MG suppository Place 1 suppository (25 mg total) rectally 2 (two) times daily. PATIENT NEEDS OFFICE VISIT FOR ADDITIONAL REFILLS 07/08/14  Yes Tonye Pearson, MD  hydrocortisone (ANUSOL-HC) 25 MG suppository Place 1 suppository (25 mg total) rectally 2 (two) times daily. 09/12/14  Yes Tonye Pearson, MD  HYDROCORTISONE ACE, RECTAL, 30 MG SUPP Place 1 suppository (30 mg total) rectally 2 (two) times daily. 05/05/12  Yes Le, Thao P, DO  lisinopril-hydrochlorothiazide (PRINZIDE,ZESTORETIC) 10-12.5 MG per tablet TAKE 1 TABLET ONCE  DAILY. 11/10/14  Yes Tonye Pearson, MD  meloxicam (MOBIC) 15 MG tablet Take 1 tablet (15 mg total) by mouth daily. 05/07/16  Yes Jeffery, Chelle, PA-C  OPTIVAR 0.05 % ophthalmic solution INSTILL 1 DROP IN EACH EYE TWICE DAILY. 10/25/11  Yes Dunn, Ryan M, PA-C  PROCTOZONE-HC 2.5 % rectal cream APPLY RECTALLY TWICE DAILY AS DIRECTED. 01/02/14  Yes Tonye Pearson, MD  typhoid (VIVOTIF) DR capsule Take 1 capsule by mouth every other day. X 4 doses. 12/10/16  Yes Jeffery, Chelle, PA-C  cyclobenzaprine (FLEXERIL) 10 MG tablet Take 1 tablet (10 mg total) by mouth at bedtime. Patient not taking: Reported on 07/19/2016 08/06/14   Tonye Pearson, MD  doxycycline (VIBRAMYCIN) 100 MG capsule Take 1 capsule (100 mg total) by mouth daily. Start 1-2 days prior to travel and continue 4 weeks upon return Patient not taking: Reported on 05/02/2017 12/10/16   Porfirio Oar, PA-C  Guaifenesin (MUCINEX MAXIMUM STRENGTH) 1200 MG TB12 Take 1 tablet (1,200 mg total) by mouth every 12 (twelve) hours as needed. Patient not taking: Reported on 05/02/2017 01/04/17   Trena Platt D, PA  oseltamivir (TAMIFLU) 75 MG capsule Take 1 capsule (75 mg total) by mouth 2 (two) times daily. Patient not taking: Reported on 05/02/2017 01/04/17   Trena Platt D, PA  sodium phosphate (FLEET) 7-19 GM/118ML ENEM Place 133 mLs (1 enema total) rectally daily as needed for severe constipation. Patient not taking: Reported on 05/02/2017 07/19/16   Doristine Bosworth,  MD    Allergies  Allergen Reactions  . Sulfa Antibiotics Rash    Patient Active Problem List   Diagnosis Date Noted  . Pain, joint, multiple sites 10/24/2013  . Sleep apnea 10/24/2013  . BMI 38.0-38.9,adult 11/07/2012  . Intrinsic asthma 11/07/2012  . OBESITY 10/25/2007  . HYPERTENSION, BENIGN 10/25/2007  . GERD 10/25/2007  . HIATAL HERNIA 10/25/2007  . INTERSTITIAL CYSTITIS 10/25/2007  . ENDOMETRIOSIS 10/25/2007  . FIBROCYSTIC BREAST DISEASE, HX OF  10/25/2007    Past Medical History:  Diagnosis Date  . Arthritis   . Asthma   . Diverticulitis large intestine 2012   pt. states this is ongoing and sometimes worse than others  . GERD (gastroesophageal reflux disease)   . Hypertension     Past Surgical History:  Procedure Laterality Date  . BREAST BIOPSY     left  . CHOLECYSTECTOMY    . COLONOSCOPY    . ESOPHAGOGASTRODUODENOSCOPY    . EXPLORATORY LAPAROTOMY     x2; oophorectomyx1  . LAPAROSCOPIC ENDOMETRIOSIS FULGURATION  1997   x3  . LAPAROTOMY  1997  . OOPHORECTOMY    . TONSILLECTOMY      Social History   Social History  . Marital status: Married    Spouse name: N/A  . Number of children: 2  . Years of education: PhD   Occupational History  . Not on file.   Social History Main Topics  . Smoking status: Never Smoker  . Smokeless tobacco: Never Used  . Alcohol use No  . Drug use: No  . Sexual activity: Yes   Other Topics Concern  . Not on file   Social History Narrative   3-4 sodas a day     Family History  Problem Relation Age of Onset  . Colon cancer Maternal Grandfather   . Colon cancer Other   . Colon cancer Paternal Grandfather   . Stroke Paternal Grandfather   . Alzheimer's disease Mother   . COPD Father   . Stroke Father   . Anxiety disorder Daughter      Review of Systems  Constitutional: Negative for chills, fever and malaise/fatigue.  HENT: Positive for congestion and sore throat (gone now). Negative for ear pain.   Eyes: Negative for discharge and redness.  Respiratory: Positive for cough and wheezing.   Cardiovascular: Negative for chest pain and palpitations.  Gastrointestinal: Negative for abdominal pain, diarrhea, nausea and vomiting.  Genitourinary: Negative for dysuria and hematuria.  Musculoskeletal: Negative for joint pain and neck pain.  Skin: Negative for rash.  Neurological: Negative for dizziness and headaches.  Endo/Heme/Allergies: Negative.   All other systems  reviewed and are negative.  Vitals:   05/02/17 0956  BP: 117/77  Pulse: 83  Resp: 18  Temp: 99.1 F (37.3 C)  SpO2: 95%     Physical Exam  Constitutional: She is oriented to person, place, and time. She appears well-nourished.  HENT:  Head: Normocephalic and atraumatic.  Right Ear: External ear normal.  Left Ear: External ear normal.  Nose: Nose normal.  Mouth/Throat: Oropharynx is clear and moist. No oropharyngeal exudate.  Eyes: Pupils are equal, round, and reactive to light. Conjunctivae and EOM are normal.  Neck: Normal range of motion. Neck supple. No JVD present.  Cardiovascular: Normal rate, regular rhythm, normal heart sounds and intact distal pulses.   Pulmonary/Chest: Effort normal and breath sounds normal. She has no wheezes. She has no rales.  Abdominal: Soft. She exhibits no distension. There is no tenderness.  Musculoskeletal: Normal range of motion.  Lymphadenopathy:    She has no cervical adenopathy.  Neurological: She is alert and oriented to person, place, and time. No sensory deficit. She exhibits normal muscle tone.  Skin: Skin is warm and dry. Capillary refill takes less than 2 seconds. No rash noted.  Psychiatric: She has a normal mood and affect. Her behavior is normal.  Vitals reviewed.    ASSESSMENT & PLAN: [place] was seen today for headache, chills and generalized body aches.  Diagnoses and all orders for this visit:  Acute bronchitis, unspecified organism  Fever, unspecified fever cause  History of asthma  Cough  Other orders -     predniSONE (DELTASONE) 20 MG tablet; Take 2 tablets (40 mg total) by mouth daily with breakfast. -     promethazine-codeine (PHENERGAN WITH CODEINE) 6.25-10 MG/5ML syrup; Take 5 mLs by mouth at bedtime as needed for cough. -     azithromycin (ZITHROMAX) 250 MG tablet; Sig as indicated    Patient Instructions       IF you received an x-ray today, you will receive an invoice from [facility].  Please contact [facility] at [phone] with questions or concerns regarding your invoice.   IF you received labwork today, you will receive an invoice from [facility]. Please contact [facility] at [phone] with questions or concerns regarding your invoice.   Our billing staff will not be able to assist you with questions regarding bills from these companies.  You will be contacted with the lab results as soon as they are available. The fastest way to get your results is to activate your My Chart account. Instructions are located on the last page of this paperwork. If you have not heard from [place] regarding the results in 2 weeks, please contact this office.     Acute Bronchitis, Adult Acute bronchitis is when air tubes (bronchi) in the lungs suddenly get swollen. The condition can make it hard to breathe. It can also cause these symptoms:  A cough.  Coughing up clear, yellow, or green mucus.  Wheezing.  Chest congestion.  Shortness of breath.  A fever.  Body aches.  Chills.  A sore throat.  Follow these instructions at home: Medicines  Take over-the-counter and prescription medicines only as told by your doctor.  If you were prescribed an antibiotic medicine, take it as told by your doctor. Do not stop taking the antibiotic even if you start to feel better. General instructions  Rest.  Drink enough fluids to keep your pee (urine) clear or pale yellow.  Avoid smoking and secondhand smoke. If you smoke and you need help quitting, ask your doctor. Quitting will help your lungs heal faster.  Use an inhaler, cool mist vaporizer, or humidifier as told by your doctor.  Keep all follow-up visits as told by your doctor. This is important. How is this prevented? To lower your risk of getting this condition again:  Wash your hands often with soap and water. If you cannot use soap and water, use hand sanitizer.  Avoid contact with people who have cold  symptoms.  Try not to touch your hands to your mouth, nose, or eyes.  Make sure to get the flu shot every year.  Contact a doctor if:  Your symptoms do not get better in 2 weeks. Get help right away if:  You cough up blood.  You have chest pain.  You have very bad shortness of breath.  You become dehydrated.  You faint (  pass out) or keep feeling like you are going to pass out.  You keep throwing up (vomiting).  You have a very bad headache.  Your fever or chills gets worse. This information is not intended to replace advice given to you by your health care provider. Make sure you discuss any questions you have with your health care provider. Document Released: 02/16/2008 Document Revised: 04/07/2016 Document Reviewed: 02/18/2016 Elsevier Interactive Patient Education  2017 [employer]      Edwina Barth, MD [facility]

## 2017-05-02 NOTE — Patient Instructions (Addendum)
     IF you received an x-ray today, you will receive an invoice from [facility]. Please contact [facility] at [phone] with questions or concerns regarding your invoice.   IF you received labwork today, you will receive an invoice from [facility]. Please contact [facility] at [phone] with questions or concerns regarding your invoice.   Our billing staff will not be able to assist you with questions regarding bills from these companies.  You will be contacted with the lab results as soon as they are available. The fastest way to get your results is to activate your My Chart account. Instructions are located on the last page of this paperwork. If you have not heard from us regarding the results in 2 weeks, please contact this office.      Acute Bronchitis, Adult Acute bronchitis is when air tubes (bronchi) in the lungs suddenly get swollen. The condition can make it hard to breathe. It can also cause these symptoms:  A cough.  Coughing up clear, yellow, or green mucus.  Wheezing.  Chest congestion.  Shortness of breath.  A fever.  Body aches.  Chills.  A sore throat.  Follow these instructions at home: Medicines  Take over-the-counter and prescription medicines only as told by your doctor.  If you were prescribed an antibiotic medicine, take it as told by your doctor. Do not stop taking the antibiotic even if you start to feel better. General instructions  Rest.  Drink enough fluids to keep your pee (urine) clear or pale yellow.  Avoid smoking and secondhand smoke. If you smoke and you need help quitting, ask your doctor. Quitting will help your lungs heal faster.  Use an inhaler, cool mist vaporizer, or humidifier as told by your doctor.  Keep all follow-up visits as told by your doctor. This is important. How is this prevented? To lower your risk of getting this condition again:  Wash your hands often with soap and water. If you cannot  use soap and water, use hand sanitizer.  Avoid contact with people who have cold symptoms.  Try not to touch your hands to your mouth, nose, or eyes.  Make sure to get the flu shot every year.  Contact a doctor if:  Your symptoms do not get better in 2 weeks. Get help right away if:  You cough up blood.  You have chest pain.  You have very bad shortness of breath.  You become dehydrated.  You faint (pass out) or keep feeling like you are going to pass out.  You keep throwing up (vomiting).  You have a very bad headache.  Your fever or chills gets worse. This information is not intended to replace advice given to you by your health care provider. Make sure you discuss any questions you have with your health care provider. Document Released: 02/16/2008 Document Revised: 04/07/2016 Document Reviewed: 02/18/2016 Elsevier Interactive Patient Education  2017 [employer]  

## 2017-05-27 ENCOUNTER — Encounter (HOSPITAL_COMMUNITY): Payer: Self-pay | Admitting: Emergency Medicine

## 2017-05-27 ENCOUNTER — Emergency Department (HOSPITAL_COMMUNITY)
Admission: EM | Admit: 2017-05-27 | Discharge: 2017-05-27 | Disposition: A | Discharge status: Home / Self Care | Payer: BLUE CROSS/BLUE SHIELD | Attending: Emergency Medicine | Admitting: Emergency Medicine

## 2017-05-27 ENCOUNTER — Emergency Department (HOSPITAL_COMMUNITY): Payer: BLUE CROSS/BLUE SHIELD

## 2017-05-27 DIAGNOSIS — I10 Essential (primary) hypertension: Secondary | ICD-10-CM | POA: Insufficient documentation

## 2017-05-27 DIAGNOSIS — Z79899 Other long term (current) drug therapy: Secondary | ICD-10-CM | POA: Insufficient documentation

## 2017-05-27 DIAGNOSIS — J45909 Unspecified asthma, uncomplicated: Secondary | ICD-10-CM | POA: Insufficient documentation

## 2017-05-27 DIAGNOSIS — Z7982 Long term (current) use of aspirin: Secondary | ICD-10-CM | POA: Insufficient documentation

## 2017-05-27 DIAGNOSIS — G454 Transient global amnesia: Secondary | ICD-10-CM

## 2017-05-27 DIAGNOSIS — R410 Disorientation, unspecified: Secondary | ICD-10-CM | POA: Diagnosis present

## 2017-05-27 LAB — URINALYSIS, ROUTINE W REFLEX MICROSCOPIC
Bilirubin Urine: NEGATIVE
GLUCOSE, UA: NEGATIVE mg/dL
HGB URINE DIPSTICK: NEGATIVE
KETONES UR: NEGATIVE mg/dL
NITRITE: NEGATIVE
PROTEIN: NEGATIVE mg/dL
Specific Gravity, Urine: 1.004 — ABNORMAL LOW (ref 1.005–1.030)
pH: 6 (ref 5.0–8.0)

## 2017-05-27 LAB — COMPREHENSIVE METABOLIC PANEL
ALBUMIN: 4.5 g/dL (ref 3.5–5.0)
ALT: 26 U/L (ref 14–54)
ANION GAP: 9 (ref 5–15)
AST: 22 U/L (ref 15–41)
Alkaline Phosphatase: 40 U/L (ref 38–126)
BILIRUBIN TOTAL: 0.7 mg/dL (ref 0.3–1.2)
BUN: 16 mg/dL (ref 6–20)
CO2: 27 mmol/L (ref 22–32)
Calcium: 9.9 mg/dL (ref 8.9–10.3)
Chloride: 105 mmol/L (ref 101–111)
Creatinine, Ser: 0.76 mg/dL (ref 0.44–1.00)
GFR calc Af Amer: >60 mL/min (ref >60)
GFR calc non Af Amer: >60 mL/min (ref >60)
GLUCOSE: 90 mg/dL (ref 65–99)
POTASSIUM: 3.7 mmol/L (ref 3.5–5.1)
Sodium: 141 mmol/L (ref 135–145)
TOTAL PROTEIN: 6.8 g/dL (ref 6.5–8.1)

## 2017-05-27 LAB — CBC
HEMATOCRIT: 42.6 % (ref 36.0–46.0)
Hemoglobin: 14.9 g/dL (ref 12.0–15.0)
MCH: 30.2 pg (ref 26.0–34.0)
MCHC: 35 g/dL (ref 30.0–36.0)
MCV: 86.4 fL (ref 78.0–100.0)
Platelets: 266 10*3/uL (ref 150–400)
RBC: 4.93 MIL/uL (ref 3.87–5.11)
RDW: 12.4 % (ref 11.5–15.5)
WBC: 8.4 10*3/uL (ref 4.0–10.5)

## 2017-05-27 LAB — CBG MONITORING, ED: Glucose-Capillary: 80 mg/dL (ref 65–99)

## 2017-05-27 LAB — I-STAT TROPONIN, ED: Troponin i, poc: 0 ng/mL (ref 0.00–0.08)

## 2017-05-27 MED ORDER — IOPAMIDOL (ISOVUE-370) INJECTION 76%
100.0000 mL | Freq: Once | INTRAVENOUS | Status: AC | PRN
  Administered 2017-05-27: 100 mL via INTRAVENOUS

## 2017-05-27 MED ORDER — IOPAMIDOL (ISOVUE-370) INJECTION 76%
INTRAVENOUS | Status: AC
  Filled 2017-05-27: qty 100

## 2017-05-27 MED ORDER — ASPIRIN EC 81 MG PO TBEC: 81.0000 mg | DELAYED_RELEASE_TABLET | Freq: Every day | ORAL | 0 refills | Status: DC
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
## 2017-05-27 NOTE — Discharge Instructions (Signed)
Start taking an aspirin 81 mg daily.  Return to the ER if symptoms return or if you develop any vision changes, weakness, numbness, difficulty speaking, or other concerning symptoms.  Follow-up with a Neurologist in the next 1-2 weeks. Call either of the numbers above to set up an appointment, or ask your primary physician for a referral.
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
## 2017-05-30 ENCOUNTER — Telehealth: Payer: Self-pay | Admitting: Neurology

## 2017-05-30 ENCOUNTER — Telehealth: Payer: Self-pay

## 2017-05-30 NOTE — Telephone Encounter (Signed)
I called pt to ask her if she is still using her cpap. Pt states that she is not using her cpap because she has lost 60 lbs.

## 2017-05-30 NOTE — Telephone Encounter (Signed)
Patient has been scheduled for 05/31/17 @ 2:00pm with check in at 1:30pm with Dr. Frances Furbish.

## 2017-05-30 NOTE — Telephone Encounter (Signed)
Appointment Request From: Janene Harvey. Lamons    With Provider: Huston Foley, MD [[facility]]    Preferred Date Range: From 05/28/2017 To 06/05/2017    Preferred Times: Any    Reason for visit: Office Visit    Comments:  I went to the emergency room at [facility] last night for sudden onset confusion and loss of short term memory, lasting hours.  TIA or stroke was ruled out and they diagnosed me with transient global amnesia but told me I needed to get an appointment within the next week or 10 days. I am much better but still "not toally with it." What do you have available? Thank you

## 2017-05-30 NOTE — Telephone Encounter (Signed)
I called pt to offer her an appt for 05/31/17 at 2:00pm with Dr. Frances Furbish. No answer, left a message asking her to call me back. If pt calls me back, please see if she is able to make this appt.

## 2017-05-31 ENCOUNTER — Ambulatory Visit (INDEPENDENT_AMBULATORY_CARE_PROVIDER_SITE_OTHER): Payer: BLUE CROSS/BLUE SHIELD | Admitting: Neurology

## 2017-05-31 ENCOUNTER — Encounter: Payer: Self-pay | Admitting: Neurology

## 2017-05-31 VITALS — BP 125/73 | HR 68 | Ht 62.0 in | Wt 170.0 lb

## 2017-05-31 DIAGNOSIS — G454 Transient global amnesia: Secondary | ICD-10-CM | POA: Diagnosis not present

## 2017-05-31 NOTE — Patient Instructions (Addendum)
You may have had an episode of TGA: transient global amnesia, during which patients can have some loss of time, typically no stroke like presentation and patients typically continue to talk and move, but have no recollection of the period, which can last for minutes or hours. We don't quite understand the etiology or significance of TGA as yet. Some reports indicate a migraine-like etiology, some others mention a seizure-like presentation or even stroke-like origin.   We will do an EEG (brainwave test), which we will schedule. We will call you with the results. We will do a brain scan, called MRI and call you with the test results. We will have to schedule you for this on a separate date. This test requires authorization from your insurance, and we will take care of the insurance process.  Reassuringly, your neurological exam is normal. So long as your test results are benign and age-appropriate I will see you back on an as-needed basis.

## 2017-05-31 NOTE — Progress Notes (Signed)
Subjective:    Patient ID: Mckenzie Vasquez is a 66 y.o. female.  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 The patient is unaccompanied today. I have seen her previously for obstructive sleep apnea. She was last seen on 08/25/2015, at which time she was fully compliant with her CPAP. She presented to the emergency room on 05/27/2017 with an episode of confusion. She was brought in by her husband who reported that she was asking the same questions over and over again and seemed confused after waking up from a nap. Symptoms lasted for about 20 or 30 minutes. She had no associated headache or one-sided weakness or numbness or tingling or slurring of speech or droopy face. I reviewed the emergency room records. She had a CT head with and without contrast and CT angiogram of head and neck with and without contrast on 05/27/2017 which I reviewed: IMPRESSION: CTA HEAD:   1. Normal CT HEAD with and without contrast for age. CTA NECK:   1. No hemodynamically significant stenosis or acute vascular process. 2. Moderate to severe C5-6 and C6-7 neural foraminal narrowing. CTA HEAD:   1. No hemodynamically significant stenosis or acute vascular process.  She was diagnosed with TGA. She reports feeling okay, nearly back to normal, had some residual mild confusion the next day, some lingering dull achy headache which appears to be global since then but nothing life altering, nothing to warrant medication. She does take meloxicam every day for arthritis pain, particularly left hip pain. As far as her sleep apnea, she does not use her CPAP any longer and does not report any residual snoring at this point,  had significant weight loss in the past couple of years. At her last appointment with me in December 2016 she weighed 221 pounds, currently 170.   Previously (copied from previous notes for reference):   08/25/2015: She reports a recent bout of pinkeye bilaterally. She is improving. Weight has been stable. She has problems going back to sleep when she wakes up in the middle of the night. Overall, she believes sleep quality is better but she is still bothered by her inability to go to sleep sometimes after 3:30 PM. She will wake up usually between 1:30 into for the first time and then again around 3:30 and sometimes cannot go back to sleep after 3:30 or 4 AM. She has been compliant with treatment. She has adjusted to treatment fairly well, better than she anticipated.   I saw her on 05/12/2015 for reevaluation of her sleep apnea history. She reported snoring and difficulty with sleep maintenance as well as not waking up rested. I invited her back for a sleep study. She her baseline sleep study, followed by a CPAP titration study, and I went over her test results with her in detail today. Her baseline sleep study from 06/03/2015, showed a sleep efficiency of 82.3% with a prolonged sleep latency of 36 minutes and wake after sleep onset of 32.5 minutes with mild to moderate sleep fragmentation. She had a mildly elevated arousal index. She had a normal percentage of light stage sleep, an increased percentage of slow-wave sleep, and a mildly decreased percentage of REM sleep with a prolonged REM latency. She had no significant PLMS,  EKG or EEG changes. She had intermittent mild to moderate and prayer loud snoring. Total AHI was 8.3 per hour, rising to 39.2 per hour during REM sleep and 22.2 per hour in the supine position. Average oxygen saturation was 94%, nadir was 87%. Time below 90% saturation was nearly 3 minutes. Based on her sleep-related complaints and her test results I asked her to return for full night  CPAP titration study. She had this on 07/03/2015. Sleep efficiency was 83.2%, sleep latency 21.5 minutes and wake after sleep onset 44 minutes with mild sleep fragmentation noted. She had a mildly elevated arousal index, normal percentages for light stage sleep, an increased percentage of slow-wave sleep, and a mildly decreased percentage of REM sleep with a mildly prolonged REM latency. She had moderate PLMS with an index of 47.4 per hour, resulting in 8.3 arousals per hour. She had no significant EKG or EEG changes. CPAP was titrated from 5 cm to 7 cm. AHI was 0 per hour at the final pressure. Supine REM sleep was achieved. Average oxygen saturation was 94%, nadir was 90%. Based on her test results are prescribed CPAP therapy for home use.    I reviewed her CPAP compliance data from 07/22/2015 through 08/20/2015 which is a total of 30 days during which time she used her machine every night with percent used days greater than 4 hours at 100%, indicating superb compliance with an average usage of 6 hours and 49 minutes, residual AHI low at 0.8 per hour, leak low with the 95th percentile at 3.4 L/m on a pressure of 7 cm with EPR of 3.    05/12/2015: She reports no significant changes in her sleep related complaints. She continues to have snoring and wakes up not refreshed. She does drink 3-4 cans of [facility] [place] and some tea during the day to help her keep awake and alert. She works full-time as a Engineer, water. In the interim, her weight has remained about the same. She denies any significant morning headaches but goes to the bathroom at least once in the early morning hours. Since I first met her, her son has gotten married and she became the first time grandmother as her daughter had her first child. She saw Dr. Laney Pastor yesterday for her arthritis. Her Epworth sleepiness score is 5 out of a maximum of 24 today, however, this is in the context of drinking 3-4 cans of [facility] and some tea every day.  Without this she feels that she would be much more sleepy.   I first met her on 12/13/2013 at the request of her primary care physician, at which time she reported snoring and difficulty with sleep maintenance as well as nonrestorative sleep. I invited her back for sleep study but she called back on 01/07/2014 requesting that we postpone the sleep study due to financial reasons and high deductible.    12/13/2013: She reports snoring, and problems with her sleep, including non-restorative sleep and sleep maintenance.    Her typical bedtime is reported to be around 11 PM and usual wake time is around 5:30 AM. Sleep onset typically occurs within 20 minutes. She reports feeling poorly rested upon awakening. She wakes up on an average 1 times in the middle of the night and has to go to the bathroom 0 times on a typical night. She denies morning headaches.   She reports excessive daytime somnolence (EDS) and Her Epworth Sleepiness Score (ESS) is 3/24 today. However, she reports drinking a lot of [facility]  [place] during the day, up to 5-6 cans. She has not fallen asleep while driving, but has come close to dozing off, but would always pull over if the were sleepy. The patient has not been taking a scheduled nap.   She has been known to snore for the past many years. Snoring is reportedly marked, and not clear if associated with choking sounds and witnessed apneas. The patient denies a sense of choking or strangling feeling. There is report of nighttime reflux, with occasional nighttime cough experienced. The patient has not noted any RLS symptoms and is not known to kick while asleep or before falling asleep. There is no family history of RLS or OSA.   She is a restless sleeper and in the morning, the bed is quite disheveled.    She denies cataplexy, sleep paralysis, hypnagogic or hypnopompic hallucinations, or sleep attacks. She does not report any vivid dreams, nightmares, dream enactments, or parasomnias, such as  sleep talking or sleep walking. The patient has not had a sleep study or a home sleep test.   Her bedroom is usually dark and cool. There is a TV in the bedroom and usually it is on at night.      Her Past Medical History Is Significant For: Past Medical History:  Diagnosis Date  . Arthritis   . Asthma   . Diverticulitis large intestine 2012   pt. states this is ongoing and sometimes worse than others  . GERD (gastroesophageal reflux disease)   . Hypertension     Her Past Surgical History Is Significant For: Past Surgical History:  Procedure Laterality Date  . BREAST BIOPSY     left  . CHOLECYSTECTOMY    . COLONOSCOPY    . ESOPHAGOGASTRODUODENOSCOPY    . EXPLORATORY LAPAROTOMY     x2; oophorectomyx1  . LAPAROSCOPIC ENDOMETRIOSIS FULGURATION  1997   x3  . LAPAROTOMY  1997  . OOPHORECTOMY    . TONSILLECTOMY      Her Family History Is Significant For: Family History  Problem Relation Age of Onset  . Colon cancer Maternal Grandfather   . Colon cancer Other   . Colon cancer Paternal Grandfather   . Stroke Paternal Grandfather   . Alzheimer's disease Mother   . COPD Father   . Stroke Father   . Anxiety disorder Daughter     Her Social History Is Significant For: Social History   Social History  . Marital status: Married    Spouse name: N/A  . Number of children: 2  . Years of education: PhD   Social History Main Topics  . Smoking status: Never Smoker  . Smokeless tobacco: Never Used  . Alcohol use No  . Drug use: No  . Sexual activity: Yes   Other Topics Concern  . None   Social History Narrative   3-4 sodas a day     Her Allergies Are:  Allergies  Allergen Reactions  . Sulfa Antibiotics Rash  :   Her Current Medications Are:  Outpatient Encounter Prescriptions as of 05/31/2017  Medication Sig  . albuterol (PROAIR HFA) 108 (90 BASE) MCG/ACT inhaler Inhale 2 puffs into the lungs every 4 (four) hours as needed.  Marland Kitchen aspirin EC 81 MG tablet Take 1  tablet (81 mg total) by mouth daily.  . Estradiol (VAGIFEM VA) Place 1 application vaginally daily.  . hydrocortisone (ANUCORT-HC) 25 MG suppository Place 1 suppository (25 mg total) rectally 2 (two) times daily. PATIENT NEEDS OFFICE VISIT FOR ADDITIONAL REFILLS  .  lisinopril-hydrochlorothiazide (PRINZIDE,ZESTORETIC) 10-12.5 MG per tablet TAKE 1 TABLET ONCE DAILY.  . meloxicam (MOBIC) 15 MG tablet Take 1 tablet (15 mg total) by mouth daily.  . OPTIVAR 0.05 % ophthalmic solution INSTILL 1 DROP IN EACH EYE TWICE DAILY.  Marland Kitchen PROCTOZONE-HC 2.5 % rectal cream APPLY RECTALLY TWICE DAILY AS DIRECTED.  . [DISCONTINUED] azithromycin (ZITHROMAX) 250 MG tablet Sig as indicated (Patient not taking: Reported on 05/27/2017)  . [DISCONTINUED] ciprofloxacin (CIPRO) 500 MG tablet Take 1 tablet (500 mg total) by mouth 2 (two) times daily. (Patient not taking: Reported on 05/27/2017)  . [DISCONTINUED] cyclobenzaprine (FLEXERIL) 10 MG tablet Take 1 tablet (10 mg total) by mouth at bedtime. (Patient not taking: Reported on 07/19/2016)  . [DISCONTINUED] doxycycline (VIBRAMYCIN) 100 MG capsule Take 1 capsule (100 mg total) by mouth daily. Start 1-2 days prior to travel and continue 4 weeks upon return (Patient not taking: Reported on 05/02/2017)  . [DISCONTINUED] Guaifenesin (MUCINEX MAXIMUM STRENGTH) 1200 MG TB12 Take 1 tablet (1,200 mg total) by mouth every 12 (twelve) hours as needed. (Patient not taking: Reported on 05/02/2017)  . [DISCONTINUED] oseltamivir (TAMIFLU) 75 MG capsule Take 1 capsule (75 mg total) by mouth 2 (two) times daily. (Patient not taking: Reported on 05/02/2017)  . [DISCONTINUED] promethazine-codeine (PHENERGAN WITH CODEINE) 6.25-10 MG/5ML syrup Take 5 mLs by mouth at bedtime as needed for cough. (Patient not taking: Reported on 05/27/2017)  . [DISCONTINUED] sodium phosphate (FLEET) 7-19 GM/118ML ENEM Place 133 mLs (1 enema total) rectally daily as needed for severe constipation. (Patient not taking: Reported  on 05/02/2017)  . [DISCONTINUED] typhoid (VIVOTIF) DR capsule Take 1 capsule by mouth every other day. X 4 doses. (Patient not taking: Reported on 05/27/2017)   No facility-administered encounter medications on file as of 05/31/2017.   :  Review of Systems:  Out of a complete 14 point review of systems, all are reviewed and negative with the exception of these symptoms as listed below: Review of Systems  Neurological:       Pt presents today to discuss recent confusional spells. Pt reports that she is still suffering from headaches.   Objective:  Neurological Exam  Physical Exam Physical Examination:   Vitals:   05/31/17 1416  BP: 125/73  Pulse: 68    General Examination: The patient is a very pleasant 66 y.o. female in no acute distress. She appears well-developed and well-nourished and well groomed.   HEENT: Normocephalic, atraumatic, pupils are equal, round and reactive to light and accommodation. Extraocular tracking is good without limitation to gaze excursion or nystagmus noted. Normal smooth pursuit is noted. Hearing is grossly intact. Tympanic membranes are clear bilaterally. Face is symmetric with normal facial animation and normal facial sensation. Speech is clear with no dysarthria noted. There is no hypophonia. There is no lip, neck/head, jaw or voice tremor. Neck is supple with full range of passive and active motion. There are no carotid bruits on auscultation. Oropharynx exam reveals: mild mouth dryness, good dental hygiene. Tongue protrudes centrally and palate elevates symmetrically.   Chest: Clear to auscultation without wheezing, rhonchi or crackles noted.  Heart: S1+S2+0, regular and normal without murmurs, rubs or gallops noted.   Abdomen: Soft, non-tender and non-distended with normal bowel sounds appreciated on auscultation.  Extremities: There is no pitting edema in the distal lower extremities bilaterally. Pedal pulses are intact.  Skin: Warm and dry without  trophic changes noted.  Musculoskeletal: exam reveals no obvious joint deformities, tenderness or joint swelling or erythema.  Neurologically:  Mental status: The patient is awake, alert and oriented in all 4 spheres. Her immediate and remote memory, attention, language skills and fund of knowledge are appropriate. There is no evidence of aphasia, agnosia, apraxia or anomia. Speech is clear with normal prosody and enunciation. Thought process is linear. Mood is normal and affect is normal.  Cranial nerves II - XII are as described above under HEENT exam. In addition: shoulder shrug is normal with equal shoulder height noted. Motor exam: Normal bulk, strength and tone is noted. There is no drift, tremor or rebound. Romberg is negative. Reflexes are 2+ throughout. Babinski: Toes are flexor bilaterally. Fine motor skills and coordination: intact with normal finger taps, normal hand movements, normal rapid alternating patting, normal foot taps and normal foot agility.  Cerebellar testing: No dysmetria or intention tremor on finger to nose testing. Heel to shin is unremarkable bilaterally. There is no truncal or gait ataxia.  Sensory exam: intact to light touch, pinprick, vibration, temperature sense in the upper and lower extremities.  Gait, station and balance: She stands easily. No veering to one side is noted. No leaning to one side is noted. Posture is age-appropriate and stance is narrow based. Gait shows normal stride length and normal pace. No problems turning are noted. Tandem walk is slightly challenging.   Assessment and Plan:    In summary, Mckenzie Vasquez is a very pleasant 66 y.o.-year old female with an underlying medical history of arthritis, obesity, reflux disease, hypertension, interstitial cystitis, endometriosis, asthma and fibrocystic disease, who presents for neurologic consultation of an episode of altered mental status/confusion, concern for transient global amnesia for which she  presented to the emergency room last week. She is essentially back to baseline with the exception of a mild lingering headache since then. She has a normal neurological exam and is reassured in that regard. She had a CT angiogram head and neck as well as head CT in the emergency room with benign results. I would like to proceed with more detailed structural testing of her brain with brain MRI with and without contrast and also EEG. So long as these tests are benign we can call her with her test results and see her back on an as-needed basis. She is reminded to allow for enough sleep time and pursue healthy lifestyle, better hydration with water rather than tea and soda. I suggested as needed follow-up, we will keep her posted as to her test results in the interim. I answered all her questions today and she was in agreement.   Thank you very much for allowing me to participate in the care of this nice patient. If I can be of any further assistance to you please do not hesitate to call me at [phone].  Sincerely,   Star Age, MD, PhD

## 2017-06-08 ENCOUNTER — Ambulatory Visit (INDEPENDENT_AMBULATORY_CARE_PROVIDER_SITE_OTHER): Payer: BLUE CROSS/BLUE SHIELD | Admitting: Neurology

## 2017-06-08 DIAGNOSIS — R2990 Unspecified symptoms and signs involving the nervous system: Secondary | ICD-10-CM | POA: Diagnosis not present

## 2017-06-08 DIAGNOSIS — G454 Transient global amnesia: Secondary | ICD-10-CM

## 2017-06-10 NOTE — Procedures (Addendum)
   HISTORY: 66 year old female, sent cultures, with recent diagnosis of transient global amnesia TECHNIQUE:  16 channel EEG was performed based on standard 10-16 international system. One channel was dedicated to EKG, which has demonstrates normal sinus rhythm of 84 beats per minutes.  Upon awakening, the posterior background activity was well-developed, in alpha range, there was also frontal predominant small amplitude theta range activity, reactive to eye opening and closure.  There was no evidence of epileptiform discharge.  Photic stimulation was performed, which induced a symmetric photic driving.  Hyperventilation was performed, there was no abnormality elicit.  Stage II sleep was achieved.  CONCLUSION: This is a  normal awake and sleep EEG.  There is no electrodiagnostic evidence of epileptiform discharge.    Levert Feinstein, M.D. Ph.D.  [facility] [address] [place], [place] [address] Phone: [phone] Fax:      [phone]

## 2017-06-10 NOTE — Progress Notes (Signed)
Please call and advise the patient that the EEG or brain wave test we performed was reported as normal in the awake and asleep states. We checked for abnormal electrical discharges in the brain waves and the report suggested normal findings. No further action is required on this test at this time. Please remind patient to keep any upcoming appointments or tests and to call us with any interim questions, concerns, problems or updates.  We will call her after brain MRI for results.  Thanks,  Huston Foley, MD, PhD

## 2017-06-13 ENCOUNTER — Telehealth: Payer: Self-pay

## 2017-06-13 NOTE — Telephone Encounter (Signed)
-----   Message from Huston Foley, MD sent at 06/10/2017 12:58 PM EDT ----- Please call and advise the patient that the EEG or brain wave test we performed was reported as normal in the awake and asleep states. We checked for abnormal electrical discharges in the brain waves and the report suggested normal findings. No further action is required on this test at this time. Please remind patient to keep any upcoming appointments or tests and to call us with any interim questions, concerns, problems or updates.  We will call her after brain MRI for results.  Thanks,  Huston Foley, MD, PhD

## 2017-06-13 NOTE — Telephone Encounter (Signed)
I called pt, left a detailed message on cell number per DPR, advising pt that her EEG was normal and that we will call her with MRI results when they are available. I asked pt to call back with any questions or concerns.

## 2017-06-14 ENCOUNTER — Ambulatory Visit (INDEPENDENT_AMBULATORY_CARE_PROVIDER_SITE_OTHER): Payer: BLUE CROSS/BLUE SHIELD

## 2017-06-14 DIAGNOSIS — G454 Transient global amnesia: Secondary | ICD-10-CM | POA: Diagnosis not present

## 2017-06-14 MED ORDER — GADOPENTETATE DIMEGLUMINE 469.01 MG/ML IV SOLN: 15.0000 mL | Freq: Once | INTRAVENOUS | Status: AC | PRN

## 2017-06-16 ENCOUNTER — Telehealth: Payer: Self-pay

## 2017-06-16 ENCOUNTER — Encounter: Payer: Self-pay | Admitting: Neurology

## 2017-06-16 NOTE — Progress Notes (Signed)
MRI brain showed age appropriate findings, no acute findings.  Can FU as needed, as discussed. thx Huston Foley, MD, PhD [facility])

## 2017-06-16 NOTE — Telephone Encounter (Signed)
I called pt. I advised her that her MRI brain showed age appropriate findings, no acute findings. Pt may follow up as needed at this pt. Pt verbalized understanding of results.

## 2017-06-16 NOTE — Telephone Encounter (Signed)
-----   Message from Geronimo Running, RN sent at 06/16/2017  2:48 PM EDT -----   ----- Message ----- From: Huston Foley, MD Sent: 06/16/2017   1:00 PM To: Geronimo Running, RN  MRI brain showed age appropriate findings, no acute findings.  Can FU as needed, as discussed. thx Huston Foley, MD, PhD [facility])

## 2017-06-17 ENCOUNTER — Ambulatory Visit (INDEPENDENT_AMBULATORY_CARE_PROVIDER_SITE_OTHER): Payer: BLUE CROSS/BLUE SHIELD | Admitting: Emergency Medicine

## 2017-06-17 DIAGNOSIS — Z23 Encounter for immunization: Secondary | ICD-10-CM

## 2017-06-17 NOTE — Progress Notes (Signed)
Here for vaccination only.

## 2017-06-17 NOTE — Progress Notes (Signed)
Subjective:     Patient ID: Mckenzie Vasquez, female   DOB: [birth year], 66 y.o.   MRN: [id number]  HPI    Patient here for 3rd Hep B only.   Review of Systems     Objective:   Physical Exam     Assessment:         Plan:

## 2017-06-20 ENCOUNTER — Other Ambulatory Visit: Payer: Self-pay | Admitting: Obstetrics and Gynecology

## 2017-06-20 DIAGNOSIS — Z1231 Encounter for screening mammogram for malignant neoplasm of breast: Secondary | ICD-10-CM

## 2017-08-12 ENCOUNTER — Ambulatory Visit
Admission: RE | Admit: 2017-08-12 | Discharge: 2017-08-12 | Disposition: A | Discharge status: Home / Self Care | Payer: BLUE CROSS/BLUE SHIELD | Source: Ambulatory Visit | Attending: Obstetrics and Gynecology | Admitting: Obstetrics and Gynecology

## 2017-08-12 DIAGNOSIS — Z1231 Encounter for screening mammogram for malignant neoplasm of breast: Secondary | ICD-10-CM

## 2017-10-21 ENCOUNTER — Encounter: Payer: Self-pay | Admitting: Family Medicine

## 2017-10-21 ENCOUNTER — Ambulatory Visit: Payer: BLUE CROSS/BLUE SHIELD | Admitting: Family Medicine

## 2017-10-21 ENCOUNTER — Other Ambulatory Visit: Payer: Self-pay

## 2017-10-21 VITALS — BP 120/74 | HR 97 | Temp 99.1°F | Resp 18 | Ht 62.84 in | Wt 176.8 lb

## 2017-10-21 DIAGNOSIS — J069 Acute upper respiratory infection, unspecified: Secondary | ICD-10-CM

## 2017-10-21 DIAGNOSIS — R6889 Other general symptoms and signs: Secondary | ICD-10-CM

## 2017-10-21 LAB — POC INFLUENZA A&B (BINAX/QUICKVUE)
Influenza A, POC: NEGATIVE
Influenza B, POC: NEGATIVE

## 2017-10-21 MED ORDER — BENZONATATE 100 MG PO CAPS: 100.0000 mg | ORAL_CAPSULE | Freq: Two times a day (BID) | ORAL | 0 refills | Status: DC | PRN

## 2017-10-21 NOTE — Patient Instructions (Addendum)
     IF you received an x-ray today, you will receive an invoice from [facility]. Please contact [facility] at [phone] with questions or concerns regarding your invoice.   IF you received labwork today, you will receive an invoice from [facility]. Please contact [facility] at [phone] with questions or concerns regarding your invoice.   Our billing staff will not be able to assist you with questions regarding bills from these companies.  You will be contacted with the lab results as soon as they are available. The fastest way to get your results is to activate your My Chart account. Instructions are located on the last page of this paperwork. If you have not heard from us regarding the results in 2 weeks, please contact this office.     Upper Respiratory Infection, Adult Most upper respiratory infections (URIs) are caused by a virus. A URI affects the nose, throat, and upper air passages. The most common type of URI is often called "the common cold." Follow these instructions at home:  Take medicines only as told by your doctor.  Gargle warm saltwater or take cough drops to comfort your throat as told by your doctor.  Use a warm mist humidifier or inhale steam from a shower to increase air moisture. This may make it easier to breathe.  Drink enough fluid to keep your pee (urine) clear or pale yellow.  Eat soups and other clear broths.  Have a healthy diet.  Rest as needed.  Go back to work when your fever is gone or your doctor says it is okay. ? You may need to stay home longer to avoid giving your URI to others. ? You can also wear a face mask and wash your hands often to prevent spread of the virus.  Use your inhaler more if you have asthma.  Do not use any tobacco products, including cigarettes, chewing tobacco, or electronic cigarettes. If you need help quitting, ask your doctor. Contact a doctor if:  You are getting worse, not better.  Your  symptoms are not helped by medicine.  You have chills.  You are getting more short of breath.  You have brown or red mucus.  You have yellow or brown discharge from your nose.  You have pain in your face, especially when you bend forward.  You have a fever.  You have puffy (swollen) neck glands.  You have pain while swallowing.  You have white areas in the back of your throat. Get help right away if:  You have very bad or constant: ? Headache. ? Ear pain. ? Pain in your forehead, behind your eyes, and over your cheekbones (sinus pain). ? Chest pain.  You have long-lasting (chronic) lung disease and any of the following: ? Wheezing. ? Long-lasting cough. ? Coughing up blood. ? A change in your usual mucus.  You have a stiff neck.  You have changes in your: ? Vision. ? Hearing. ? Thinking. ? Mood. This information is not intended to replace advice given to you by your health care provider. Make sure you discuss any questions you have with your health care provider. Document Released: 02/16/2008 Document Revised: 05/02/2016 Document Reviewed: 12/05/2013 Elsevier Interactive Patient Education  2018 [employer]  

## 2017-10-21 NOTE — Progress Notes (Signed)
2/8/20198:25 AM  Mckenzie SinningKathleen Vasquez January 1950-12-07, 67 y.o. female [id number]  Chief Complaint  Patient presents with  . Cough    X 2 days- pt states cold like symptoms  . Fatigue    X 2 - 3 days    HPI:   Patient is a 67 y.o. female with past medical history significant for asthma who presents today for 2 days of dry coughing, fits makes her nauseous, headache, extreme fatigue and fevers last night, nasal congestion. Had some mild chest tightness last night but not enough to use her albuterol, none today. No vomiting or diarrhea. No ear pain, sinus pain or sore throat. No rashes. Did have this season's flu vaccine.  [facility] 10/21/2017 05/02/2017 01/04/2017  Decreased Interest 0 0 0  Down, Depressed, Hopeless 0 0 0  PHQ - 2 Score 0 0 0    Allergies  Allergen Reactions  . Sulfa Antibiotics Rash    Prior to Admission medications   Medication Sig Start Date End Date Taking? Authorizing Provider  albuterol (PROAIR HFA) 108 (90 BASE) MCG/ACT inhaler Inhale 2 puffs into the lungs every 4 (four) hours as needed. 01/21/14  Yes Carmelina DaneAnderson, Jeffery S, MD  aspirin EC 81 MG tablet Take 1 tablet (81 mg total) by mouth daily. 05/27/17  Yes Shaune PollackIsaacs, Cameron, MD  Estradiol [facility](VAGIFEM [facility]) Place 1 application vaginally daily.   Yes [provider]  hydrocortisone (ANUCORT-HC) 25 MG suppository Place 1 suppository (25 mg total) rectally 2 (two) times daily. PATIENT NEEDS OFFICE VISIT FOR ADDITIONAL REFILLS 07/08/14  Yes Tonye Pearsonoolittle, Robert P, MD  lisinopril-hydrochlorothiazide (PRINZIDE,ZESTORETIC) 10-12.5 MG per tablet TAKE 1 TABLET ONCE DAILY. 11/10/14  Yes Tonye Pearsonoolittle, Robert P, MD  meloxicam (MOBIC) 15 MG tablet Take 1 tablet (15 mg total) by mouth daily. 05/07/16  Yes Jeffery, Chelle, PA-C  OPTIVAR 0.05 % ophthalmic solution INSTILL 1 DROP IN EACH EYE TWICE DAILY. 10/25/11  Yes Dunn, Ryan M, PA-C  PROCTOZONE-HC 2.5 % rectal cream APPLY RECTALLY TWICE DAILY AS DIRECTED. 01/02/14  Yes  Tonye Pearsonoolittle, Robert P, MD    Past Medical History:  Diagnosis Date  . Arthritis   . Asthma   . Diverticulitis large intestine 2012   pt. states this is ongoing and sometimes worse than others  . GERD (gastroesophageal reflux disease)   . Hypertension     Past Surgical History:  Procedure Laterality Date  . BREAST BIOPSY     left  . BREAST EXCISIONAL BIOPSY Left    benign  . CHOLECYSTECTOMY    . COLONOSCOPY    . ESOPHAGOGASTRODUODENOSCOPY    . EXPLORATORY LAPAROTOMY     x2; oophorectomyx1  . LAPAROSCOPIC ENDOMETRIOSIS FULGURATION  1997   x3  . LAPAROTOMY  1997  . OOPHORECTOMY    . TONSILLECTOMY      Social History   Tobacco Use  . Smoking status: Never Smoker  . Smokeless tobacco: Never Used  Substance Use Topics  . Alcohol use: No    Alcohol/week: 0.0 oz    Family History  Problem Relation Age of Onset  . Colon cancer Maternal Grandfather   . Colon cancer Other   . Colon cancer Paternal Grandfather   . Stroke Paternal Grandfather   . Alzheimer's disease Mother   . COPD Father   . Stroke Father   . Anxiety disorder Daughter     ROS Per hpi  OBJECTIVE:  Blood pressure 120/74, pulse 97, temperature 99.1 F (37.3 C), temperature source Oral, resp. rate  18, height 5' 2.84" (1.596 Vasquez), weight 176 lb 12.8 oz (80.2 kg), SpO2 98 %.  Physical Exam  Constitutional: She is oriented to person, place, and time and well-developed, well-nourished, and in no distress.  HENT:  Head: Normocephalic and atraumatic.  Right Ear: Hearing, tympanic membrane, external ear and ear canal normal.  Left Ear: Hearing, tympanic membrane, external ear and ear canal normal.  Mouth/Throat: Oropharynx is clear and moist.  Eyes: EOM are normal. Pupils are equal, round, and reactive to light.  Neck: Neck supple.  Cardiovascular: Normal rate, regular rhythm and normal heart sounds. Exam reveals no gallop and no friction rub.  No murmur heard. Pulmonary/Chest: Effort normal and breath  sounds normal. She has no wheezes. She has no rales.  Lymphadenopathy:    She has no cervical adenopathy.  Neurological: She is alert and oriented to person, place, and time. Gait normal.  Skin: Skin is warm and dry.      Results for orders placed or performed in visit on 10/21/17 (from the past 24 hour(s))  POC Influenza A&B(BINAX/QUICKVUE)     Status: None   Collection Time: 10/21/17  8:41 AM  Result Value Ref Range   Influenza A, POC Negative Negative   Influenza B, POC Negative Negative    ASSESSMENT and PLAN  1. URI with cough and congestion Discussed supportive measures for URI: increase hydration, rest, OTC medications, etc. RTC precautions discussed.  2. Flu-like symptoms - POC Influenza A&B(BINAX/QUICKVUE)  Other orders - benzonatate (TESSALON) 100 MG capsule; Take 1 capsule (100 mg total) by mouth 2 (two) times daily as needed for cough.  Return if symptoms worsen or fail to improve.    Myles Lipps, MD Primary Care at [facility] [address] [place], [place] [address] Ph.  [phone] Fax [phone]

## 2017-12-21 ENCOUNTER — Encounter: Payer: Self-pay | Admitting: Physician Assistant

## 2018-01-24 ENCOUNTER — Ambulatory Visit: Payer: BLUE CROSS/BLUE SHIELD | Admitting: Physician Assistant

## 2018-01-24 ENCOUNTER — Telehealth: Payer: Self-pay | Admitting: Physician Assistant

## 2018-01-24 ENCOUNTER — Ambulatory Visit (INDEPENDENT_AMBULATORY_CARE_PROVIDER_SITE_OTHER): Payer: BLUE CROSS/BLUE SHIELD

## 2018-01-24 ENCOUNTER — Encounter: Payer: Self-pay | Admitting: Physician Assistant

## 2018-01-24 VITALS — BP 154/93 | HR 102 | Temp 99.9°F | Resp 17 | Ht 62.0 in | Wt 184.0 lb

## 2018-01-24 DIAGNOSIS — R062 Wheezing: Secondary | ICD-10-CM | POA: Diagnosis not present

## 2018-01-24 DIAGNOSIS — J209 Acute bronchitis, unspecified: Secondary | ICD-10-CM | POA: Diagnosis not present

## 2018-01-24 DIAGNOSIS — R05 Cough: Secondary | ICD-10-CM

## 2018-01-24 DIAGNOSIS — R059 Cough, unspecified: Secondary | ICD-10-CM

## 2018-01-24 LAB — POCT CBC
Granulocyte percent: 89.3 % — AB (ref 37–80)
HCT, POC: 42.9 % (ref 37.7–47.9)
Hemoglobin: 14.5 g/dL (ref 12.2–16.2)
Lymph, poc: 1.1 (ref 0.6–3.4)
MCH, POC: 29.3 pg (ref 27–31.2)
MCHC: 33.8 g/dL (ref 31.8–35.4)
MCV: 86.7 fL (ref 80–97)
MID (cbc): 0.3 (ref 0–0.9)
MPV: 6.9 fL (ref 0–99.8)
POC Granulocyte: 11.1 — AB (ref 2–6.9)
POC LYMPH PERCENT: 8.6 % — AB (ref 10–50)
POC MID %: 2.1 % (ref 0–12)
Platelet Count, POC: 269 10*3/uL (ref 142–424)
RBC: 4.95 M/uL (ref 4.04–5.48)
RDW, POC: 12.5 %
WBC: 12.4 10*3/uL — AB (ref 4.6–10.2)

## 2018-01-24 MED ORDER — HYDROCODONE-HOMATROPINE 5-1.5 MG/5ML PO SYRP: 5.0000 mL | ORAL_SOLUTION | Freq: Three times a day (TID) | ORAL | 0 refills | Status: DC | PRN

## 2018-01-24 MED ORDER — IPRATROPIUM BROMIDE 0.02 % IN SOLN
0.5000 mg | Freq: Once | RESPIRATORY_TRACT | Status: AC
  Administered 2018-01-24: 0.5 mg via RESPIRATORY_TRACT

## 2018-01-24 MED ORDER — PREDNISONE 20 MG PO TABS: 40.0000 mg | ORAL_TABLET | Freq: Every day | ORAL | 0 refills | Status: AC

## 2018-01-24 MED ORDER — ALBUTEROL SULFATE (2.5 MG/3ML) 0.083% IN NEBU
2.5000 mg | INHALATION_SOLUTION | Freq: Once | RESPIRATORY_TRACT | Status: AC
  Administered 2018-01-24: 2.5 mg via RESPIRATORY_TRACT

## 2018-01-24 MED ORDER — CLARITHROMYCIN ER 500 MG PO TB24: 1000.0000 mg | ORAL_TABLET | Freq: Every day | ORAL | 0 refills | Status: DC

## 2018-01-24 NOTE — Patient Instructions (Addendum)
Start taking Clarithromycin 1,000 mg daily for five days Take Prednisone 67m for the next 5 days.  Use your albuterol as needed for wheezing.  Hycodan is for your cough at night. This will make you drowsy. Use as directed. Do not take NyQuil with this.  Stay well hydrated. Get lost of rest. Wash your hands often.   Come back if you are not improving in 5-7 days. You may have a lingering cough for the next few weeks.   -Foods that can help speed recovery: honey, garlic, chicken soup, elderberries, green tea.  -Supplements that can help speed recovery: vitamin C, zinc, elderberry extract, quercetin, ginseng, selenium -Supplement with prebiotics and probiotics:   Cough Syrup Recipe: Sweet Lemon & Honey Thyme  Ingredients a handful of fresh thyme sprigs   1 pint of water (2 cups)  1/2 cup honey (raw is best, but regular will do)  1/2 lemon chopped Instructions 1. Place the lemon in the pint jar and cover with the honey. The honey will macerate the lemons and draw out liquids which taste so delicious! 2. Meanwhile, toss the thyme leaves into a saucepan and cover them with the water. 3. Bring the water to a gentle simmer and reduce it to half, about a cup of tea. 4. When the tea is reduced and cooled a bit, strain the sprigs & leaves, add it into the pint jar and stir it well. 5. Give it a shake and use a spoonful as needed. 6. Store your homemade cough syrup in the refrigerator for about a month.  Is there anything I can do on my own to get rid of my cough? Yes. To help get rid of your cough, you can: ?Use a humidifier in your bedroom ?Use an over-the-counter cough medicine, or suck on cough drops or hard candy ?Stop smoking, if you smoke ?If you have allergies, avoid the things you are allergic to (like pollen, dust, animals, or mold) If you have acid reflux, your doctor or nurse will tell you which lifestyle changes can help reduce symptoms.    Thank you for coming in today. I  hope you feel we met your needs.  Feel free to call PCP if you have any questions or further requests.  Please consider signing up for MyChart if you do not already have it, as this is a great way to communicate with me.  Best,  Whitney McVey, PA-C  IF you received an x-ray today, you will receive an invoice from [facility]. Please contact [facility] at [phone] questions or concerns regarding your invoice.   IF you received labwork today, you will receive an invoice from [facility] Please contact [facility] at [phone] questions or concerns regarding your invoice.   Our billing staff will not be able to assist you with questions regarding bills from these companies.  You will be contacted with the lab results as soon as they are available. The fastest way to get your results is to activate your My Chart account. Instructions are located on the last page of this paperwork. If you have not heard from [facility] the results in 2 weeks, please contact this office.

## 2018-01-24 NOTE — Progress Notes (Signed)
Mckenzie Vasquez  MRN: [id number] DOB: 08/23/1951  PCP: Merri Brunette, MD  Subjective:  Pt is a 67 year old female who presents to clinic for illness x 1 day.  Woke up a lot last night "felt bubbly in my chest".  Albuterol 4x/day. She has tried dayquil and tylenol.   has a past medical history of Arthritis, Asthma, Diverticulitis large intestine (2012), GERD (gastroesophageal reflux disease), and Hypertension.  Review of Systems  Constitutional: Positive for fatigue. Negative for chills and fever.  HENT: Positive for congestion. Negative for postnasal drip, rhinorrhea, sinus pressure and sinus pain.   Respiratory: Positive for cough, shortness of breath and wheezing. Negative for chest tightness.     Patient Active Problem List   Diagnosis Date Noted  . Fever 05/02/2017  . History of asthma 05/02/2017  . Cough 05/02/2017  . Acute bronchitis 05/02/2017  . Pain, joint, multiple sites 10/24/2013  . Sleep apnea 10/24/2013  . BMI 38.0-38.9,adult 11/07/2012  . Intrinsic asthma 11/07/2012  . OBESITY 10/25/2007  . HYPERTENSION, BENIGN 10/25/2007  . GERD 10/25/2007  . HIATAL HERNIA 10/25/2007  . INTERSTITIAL CYSTITIS 10/25/2007  . ENDOMETRIOSIS 10/25/2007  . FIBROCYSTIC BREAST DISEASE, HX OF 10/25/2007    Current Outpatient Medications on File Prior to Visit  Medication Sig Dispense Refill  . albuterol (PROAIR HFA) 108 (90 BASE) MCG/ACT inhaler Inhale 2 puffs into the lungs every 4 (four) hours as needed. 1 Inhaler 12  . aspirin EC 81 MG tablet Take 1 tablet (81 mg total) by mouth daily. 30 tablet 0  . Estradiol (VAGIFEM VA) Place 1 application vaginally daily.    . hydrocortisone (ANUCORT-HC) 25 MG suppository Place 1 suppository (25 mg total) rectally 2 (two) times daily. PATIENT NEEDS OFFICE VISIT FOR ADDITIONAL REFILLS 28 suppository 0  . lisinopril-hydrochlorothiazide (PRINZIDE,ZESTORETIC) 10-12.5 MG per tablet TAKE 1 TABLET ONCE DAILY. 30 tablet 2  . meloxicam (MOBIC) 15 MG  tablet Take 1 tablet (15 mg total) by mouth daily. 30 tablet 0  . OPTIVAR 0.05 % ophthalmic solution INSTILL 1 DROP IN EACH EYE TWICE DAILY. 6 mL 0  . PROCTOZONE-HC 2.5 % rectal cream APPLY RECTALLY TWICE DAILY AS DIRECTED. 30 g 3  . rosuvastatin (CRESTOR) 10 MG tablet Take 10 mg by mouth daily.     Current Facility-Administered Medications on File Prior to Visit  Medication Dose Route Frequency Provider Last Rate Last Dose  . gadopentetate dimeglumine (MAGNEVIST) injection 15 mL  15 mL Intravenous Once PRN Naomie Dean B, MD      . gadopentetate dimeglumine (MAGNEVIST) injection 15 mL  15 mL Intravenous Once PRN Anson Fret, MD        Allergies  Allergen Reactions  . Sulfa Antibiotics Rash     Objective:  BP (!) 154/93   Pulse (!) 102   Temp 99.9 F (37.7 C) (Oral)   Resp 17   Ht  (1.575 m)   Wt 184 lb (83.5 kg)   SpO2 98%   BMI 33.65 kg/m   Physical Exam  Constitutional: She is oriented to person, place, and time. No distress.  Cardiovascular: Normal rate, regular rhythm and normal heart sounds.  Pulmonary/Chest: Effort normal. She has wheezes in the right upper field and the left upper field. She has rhonchi in the right lower field.  Neurological: She is alert and oriented to person, place, and time.  Skin: Skin is warm and dry.  Psychiatric: Judgment normal.  Vitals reviewed.   Dg Chest 2 View  Result Date: 01/24/2018 CLINICAL DATA:  Cough with chills and fatigue EXAM: CHEST - 2 VIEW COMPARISON:  11/06/2015 FINDINGS: The heart size and mediastinal contours are within normal limits. Both lungs are clear. The visualized skeletal structures are unremarkable. IMPRESSION: No active cardiopulmonary disease. Electronically Signed   By: Marlan Palau M.D.   On: 01/24/2018 17:08   Results for orders placed or performed in visit on 01/24/18  POCT CBC  Result Value Ref Range   WBC 12.4 (A) 4.6 - 10.2 K/uL   Lymph, poc 1.1 0.6 - 3.4   POC LYMPH PERCENT 8.6 (A) 10  - 50 %L   MID (cbc) 0.3 0 - 0.9   POC MID % 2.1 0 - 12 %M   POC Granulocyte 11.1 (A) 2 - 6.9   Granulocyte percent 89.3 (A) 37 - 80 %G   RBC 4.95 4.04 - 5.48 M/uL   Hemoglobin 14.5 12.2 - 16.2 g/dL   HCT, POC 16.1 09.6 - 47.9 %   MCV 86.7 80 - 97 fL   MCH, POC 29.3 27 - 31.2 pg   MCHC 33.8 31.8 - 35.4 g/dL   RDW, POC 04.5 %   Platelet Count, POC 269 142 - 424 K/uL   MPV 6.9 0 - 99.8 fL    Assessment and Plan :  1. Acute bronchitis, unspecified organism -C/o cough and wheezing x 1 day. Chest x-ray is normal.  She is tachycardic with white blood cell count of 12.  She does not appear acutely ill.  Wheezing improved following DuoNeb.  Plan to cover with Biaxin due to abnormal vital signs and HPI.  Return to clinic if no improvement - clarithromycin (BIAXIN XL) 500 MG 24 hr tablet; Take 2 tablets (1,000 mg total) by mouth daily for 5 days.  Dispense: 10 tablet; Refill: 0  2. Wheezing - Peak flow meter - ipratropium (ATROVENT) nebulizer solution 0.5 mg - albuterol (PROVENTIL) (2.5 MG/3ML) 0.083% nebulizer solution 2.5 mg - predniSONE (DELTASONE) 20 MG tablet; Take 2 tablets (40 mg total) by mouth daily with breakfast for 5 days.  Dispense: 10 tablet; Refill: 0  3. Cough - DG Chest 2 View; Future - POCT CBC - HYDROcodone-homatropine (HYCODAN) 5-1.5 MG/5ML syrup; Take 5 mLs by mouth every 8 (eight) hours as needed for cough.  Dispense: 120 mL; Refill: 0   Whitney Shawnell Dykes, PA-C  Primary Care at [facility] 01/24/2018 4:23 PM

## 2018-01-24 NOTE — Telephone Encounter (Signed)
Copied from CRM [id number]. Topic: Quick Communication - Rx Refill/Question >> Jan 24, 2018  6:19 PM Jonette Eva wrote: Pharmacy called and states they dont have the XL but they do have the regular version, if that version is okay resend Rx with directions and dosage  Medication: clarithromycin (BIAXIN XL) 500 MG 24 hr tablet [[id number]]  Has the patient contacted their pharmacy? Yes.   (Agent: If no, request that the patient contact the pharmacy for the refill.) Preferred Pharmacy (with phone number or street name): [facility] Agent: Please be advised that RX refills may take up to 3 business days. We ask that you follow-up with your pharmacy.

## 2018-01-25 ENCOUNTER — Other Ambulatory Visit: Payer: Self-pay

## 2018-01-25 DIAGNOSIS — J209 Acute bronchitis, unspecified: Secondary | ICD-10-CM

## 2018-01-25 MED ORDER — CLARITHROMYCIN ER 500 MG PO TB24: 1000.0000 mg | ORAL_TABLET | Freq: Every day | ORAL | 0 refills | Status: DC

## 2018-01-25 MED ORDER — CLARITHROMYCIN 500 MG PO TABS: 500.0000 mg | ORAL_TABLET | Freq: Two times a day (BID) | ORAL | 0 refills | Status: DC

## 2018-01-25 NOTE — Progress Notes (Signed)
Discussed with W McVey. Chgd to  tablet 2 times daily.\ Resent.

## 2018-01-26 ENCOUNTER — Telehealth: Payer: Self-pay | Admitting: *Deleted

## 2018-01-26 NOTE — Telephone Encounter (Signed)
Whitney, [facility] needs verification on Clarithromycin (Biaxin) chart rx written 1,000 mg. Pharmacy needs calrification on Biaxin 500 mg bid, verses 1,000 mg.

## 2018-04-14 DIAGNOSIS — H2513 Age-related nuclear cataract, bilateral: Secondary | ICD-10-CM | POA: Diagnosis not present

## 2018-06-12 ENCOUNTER — Ambulatory Visit (INDEPENDENT_AMBULATORY_CARE_PROVIDER_SITE_OTHER): Payer: Medicare Other | Admitting: Family Medicine

## 2018-06-12 DIAGNOSIS — Z23 Encounter for immunization: Secondary | ICD-10-CM | POA: Diagnosis not present

## 2018-06-12 NOTE — Progress Notes (Signed)
High dose flu given RD.

## 2018-06-12 NOTE — Addendum Note (Signed)
Addended by: Lisbeth Renshaw, Hiram Mciver HUA on: 06/12/2018 02:57 PM   Modules accepted: Level of Service

## 2018-06-27 ENCOUNTER — Encounter: Payer: Self-pay | Admitting: Physician Assistant

## 2018-06-28 DIAGNOSIS — Z791 Long term (current) use of non-steroidal anti-inflammatories (NSAID): Secondary | ICD-10-CM | POA: Diagnosis not present

## 2018-06-28 DIAGNOSIS — I10 Essential (primary) hypertension: Secondary | ICD-10-CM | POA: Diagnosis not present

## 2018-07-05 DIAGNOSIS — G454 Transient global amnesia: Secondary | ICD-10-CM | POA: Diagnosis not present

## 2018-07-05 DIAGNOSIS — Z9989 Dependence on other enabling machines and devices: Secondary | ICD-10-CM | POA: Diagnosis not present

## 2018-07-05 DIAGNOSIS — M2550 Pain in unspecified joint: Secondary | ICD-10-CM | POA: Diagnosis not present

## 2018-07-05 DIAGNOSIS — Z23 Encounter for immunization: Secondary | ICD-10-CM | POA: Diagnosis not present

## 2018-07-05 DIAGNOSIS — G4733 Obstructive sleep apnea (adult) (pediatric): Secondary | ICD-10-CM | POA: Diagnosis not present

## 2018-07-05 DIAGNOSIS — M65342 Trigger finger, left ring finger: Secondary | ICD-10-CM | POA: Diagnosis not present

## 2018-07-05 DIAGNOSIS — Z0000 Encounter for general adult medical examination without abnormal findings: Secondary | ICD-10-CM | POA: Diagnosis not present

## 2018-07-05 DIAGNOSIS — R202 Paresthesia of skin: Secondary | ICD-10-CM | POA: Diagnosis not present

## 2018-07-05 DIAGNOSIS — J309 Allergic rhinitis, unspecified: Secondary | ICD-10-CM | POA: Diagnosis not present

## 2018-07-05 DIAGNOSIS — Z791 Long term (current) use of non-steroidal anti-inflammatories (NSAID): Secondary | ICD-10-CM | POA: Diagnosis not present

## 2018-07-05 DIAGNOSIS — I10 Essential (primary) hypertension: Secondary | ICD-10-CM | POA: Diagnosis not present

## 2018-07-05 DIAGNOSIS — M5030 Other cervical disc degeneration, unspecified cervical region: Secondary | ICD-10-CM | POA: Diagnosis not present

## 2018-08-12 ENCOUNTER — Ambulatory Visit: Payer: Medicare Other | Admitting: Physician Assistant

## 2018-09-29 ENCOUNTER — Other Ambulatory Visit: Payer: Self-pay | Admitting: Obstetrics and Gynecology

## 2018-09-29 DIAGNOSIS — Z1231 Encounter for screening mammogram for malignant neoplasm of breast: Secondary | ICD-10-CM

## 2018-10-10 ENCOUNTER — Ambulatory Visit
Admission: RE | Admit: 2018-10-10 | Discharge: 2018-10-10 | Disposition: A | Discharge status: Home / Self Care | Payer: Medicare Other | Source: Ambulatory Visit

## 2018-10-10 DIAGNOSIS — Z1231 Encounter for screening mammogram for malignant neoplasm of breast: Secondary | ICD-10-CM | POA: Diagnosis not present

## 2018-11-06 DIAGNOSIS — R509 Fever, unspecified: Secondary | ICD-10-CM | POA: Diagnosis not present

## 2019-02-19 DIAGNOSIS — Z124 Encounter for screening for malignant neoplasm of cervix: Secondary | ICD-10-CM | POA: Diagnosis not present

## 2019-02-19 DIAGNOSIS — Z01419 Encounter for gynecological examination (general) (routine) without abnormal findings: Secondary | ICD-10-CM | POA: Diagnosis not present

## 2019-04-14 DIAGNOSIS — H2513 Age-related nuclear cataract, bilateral: Secondary | ICD-10-CM | POA: Diagnosis not present

## 2019-06-02 DIAGNOSIS — J45909 Unspecified asthma, uncomplicated: Secondary | ICD-10-CM | POA: Diagnosis not present

## 2019-06-02 DIAGNOSIS — Z7189 Other specified counseling: Secondary | ICD-10-CM | POA: Diagnosis not present

## 2019-06-02 DIAGNOSIS — E7800 Pure hypercholesterolemia, unspecified: Secondary | ICD-10-CM | POA: Diagnosis not present

## 2019-06-02 DIAGNOSIS — I10 Essential (primary) hypertension: Secondary | ICD-10-CM | POA: Diagnosis not present

## 2019-06-02 DIAGNOSIS — Z20828 Contact with and (suspected) exposure to other viral communicable diseases: Secondary | ICD-10-CM | POA: Diagnosis not present

## 2019-06-12 ENCOUNTER — Other Ambulatory Visit: Payer: Self-pay

## 2019-06-12 ENCOUNTER — Ambulatory Visit (INDEPENDENT_AMBULATORY_CARE_PROVIDER_SITE_OTHER): Payer: Medicare Other | Admitting: Sports Medicine

## 2019-06-12 DIAGNOSIS — M722 Plantar fascial fibromatosis: Secondary | ICD-10-CM

## 2019-06-12 NOTE — Assessment & Plan Note (Signed)
History and physical suggest plantar fasciitis which was supported by ultrasound findings.  Likely exacerbated by increased activity level and chronic bone spurs on calcaneus. -Continue to use cushioned heel cup -Provided with green shoe insert and scaphoid arch support -May continue to take NSAIDs as needed for pain -Provided patient with plantar fascia stretches and instruction -Return in approximately 4 weeks to monitor progress.  May come back sooner if not feeling any type of benefit

## 2019-06-12 NOTE — Progress Notes (Signed)
PCP: Deland Pretty, MD  Subjective:   CC: Patient is a 68 y.o. female here for new onset left heel pain.  HPI: Patient states that she has had approximately 2 months of left heel pain without initial injury.  Patient endorses having this similar pain several years ago which resolved spontaneously with decreased activity and has not had problems with it since then until now.  Patient states that the pain is located in her heel and is worsened with weightbearing and increased activity.  Patient has had overall activity increase since last year when retired.  Patient got new tennis shoes in May with new heel inserts.  Patient has tried meloxicam for the pain which may or may not have helped with the pain.  At the height of her pain she was unable to bear weight for approximately 4 days on the heel.  She has adjusted her gait and but cushioned sandals to wear at all times even when in the shower to relieve pain.  Patient has also tried stretching the foot which seems to have improved the pain.  Denies any swelling, bruising.  Talked to nurse friend Using tulli heel cups Night splint Stretches and some ice  All of these helped  ROS: See HPI above. I have personally reviewed pertinent past medical history, surgical, family, and social history as appropriate.     Objective:  BP 114/72   Ht 5\' 3"  (1.6 m)   Wt 170 lb (77.1 kg)   BMI 30.11 kg/m   Physical Exam: Gen: NAD, comfortable in exam room Left foot: Observation: Positive for mild edema at ankle compared to right.  Negative for deformities Palpation: Positive for pain along medial plantar border of calcaneus on left foot.  Negative calcaneal squeeze test.  Negative for tenderness to Achilles tendon.  Negative for tenderness along palpation of anterior plantar fascia.  Negative for tenderness to palpation on right foot. ROM: Full painless range of motion of foot and ankle. Strength: Strength intact bilateral. Sensation: Symmetrical  bilateral Special tests: Patient able to walk on tiptoes and heels without difficulty while wearing her shoes. Patient has Pes cavus which is preserved with weightbearing  Ultrasound of PF On left is 0.77 cm and spur noted Smaller spur on RT and width is 0.50 cm   Assessment & Plan:  Plantar fasciitis of left foot History and physical suggest plantar fasciitis which was supported by ultrasound findings.  Likely exacerbated by increased activity level and chronic bone spurs on calcaneus. -Continue to use cushioned heel cup -Provided with green shoe insert and scaphoid arch support -May continue to take NSAIDs as needed for pain -Provided patient with plantar fascia stretches and instruction -Return in approximately 4 weeks to monitor progress.  May come back sooner if not feeling any type of benefit  I independently examined pertinent imaging in relation to cc.  Doristine Mango, [facility] PGY-2  I observed and examined the patient with the resident and agree with assessment and plan.  Note reviewed and modified by me. Ila Mcgill, MD

## 2019-06-15 DIAGNOSIS — Z23 Encounter for immunization: Secondary | ICD-10-CM | POA: Diagnosis not present

## 2019-06-18 DIAGNOSIS — H16142 Punctate keratitis, left eye: Secondary | ICD-10-CM | POA: Diagnosis not present

## 2019-07-04 DIAGNOSIS — I10 Essential (primary) hypertension: Secondary | ICD-10-CM | POA: Diagnosis not present

## 2019-07-17 DIAGNOSIS — M159 Polyosteoarthritis, unspecified: Secondary | ICD-10-CM | POA: Diagnosis not present

## 2019-07-17 DIAGNOSIS — G4733 Obstructive sleep apnea (adult) (pediatric): Secondary | ICD-10-CM | POA: Diagnosis not present

## 2019-07-17 DIAGNOSIS — J4520 Mild intermittent asthma, uncomplicated: Secondary | ICD-10-CM | POA: Diagnosis not present

## 2019-07-17 DIAGNOSIS — I6523 Occlusion and stenosis of bilateral carotid arteries: Secondary | ICD-10-CM | POA: Diagnosis not present

## 2019-07-17 DIAGNOSIS — Z791 Long term (current) use of non-steroidal anti-inflammatories (NSAID): Secondary | ICD-10-CM | POA: Diagnosis not present

## 2019-07-17 DIAGNOSIS — J309 Allergic rhinitis, unspecified: Secondary | ICD-10-CM | POA: Diagnosis not present

## 2019-07-17 DIAGNOSIS — Z23 Encounter for immunization: Secondary | ICD-10-CM | POA: Diagnosis not present

## 2019-07-17 DIAGNOSIS — N200 Calculus of kidney: Secondary | ICD-10-CM | POA: Diagnosis not present

## 2019-07-17 DIAGNOSIS — Z0000 Encounter for general adult medical examination without abnormal findings: Secondary | ICD-10-CM | POA: Diagnosis not present

## 2019-07-17 DIAGNOSIS — I10 Essential (primary) hypertension: Secondary | ICD-10-CM | POA: Diagnosis not present

## 2019-08-02 DIAGNOSIS — Z20828 Contact with and (suspected) exposure to other viral communicable diseases: Secondary | ICD-10-CM | POA: Diagnosis not present

## 2019-10-04 DIAGNOSIS — Z23 Encounter for immunization: Secondary | ICD-10-CM | POA: Diagnosis not present

## 2019-10-11 ENCOUNTER — Other Ambulatory Visit: Payer: Self-pay | Admitting: Obstetrics and Gynecology

## 2019-10-11 DIAGNOSIS — Z1231 Encounter for screening mammogram for malignant neoplasm of breast: Secondary | ICD-10-CM

## 2019-10-12 ENCOUNTER — Ambulatory Visit
Admission: RE | Admit: 2019-10-12 | Discharge: 2019-10-12 | Disposition: A | Discharge status: Home / Self Care | Payer: Medicare Other | Source: Ambulatory Visit | Attending: Obstetrics and Gynecology | Admitting: Obstetrics and Gynecology

## 2019-10-12 ENCOUNTER — Other Ambulatory Visit: Payer: Self-pay

## 2019-10-12 DIAGNOSIS — Z1231 Encounter for screening mammogram for malignant neoplasm of breast: Secondary | ICD-10-CM

## 2019-10-23 DIAGNOSIS — Z23 Encounter for immunization: Secondary | ICD-10-CM | POA: Diagnosis not present

## 2020-02-24 DIAGNOSIS — Z03818 Encounter for observation for suspected exposure to other biological agents ruled out: Secondary | ICD-10-CM | POA: Diagnosis not present

## 2020-02-24 DIAGNOSIS — Z20822 Contact with and (suspected) exposure to covid-19: Secondary | ICD-10-CM | POA: Diagnosis not present

## 2020-02-28 ENCOUNTER — Ambulatory Visit: Payer: Medicare Other | Admitting: Sports Medicine

## 2020-04-23 DIAGNOSIS — Z124 Encounter for screening for malignant neoplasm of cervix: Secondary | ICD-10-CM | POA: Diagnosis not present

## 2020-04-23 DIAGNOSIS — Z01419 Encounter for gynecological examination (general) (routine) without abnormal findings: Secondary | ICD-10-CM | POA: Diagnosis not present

## 2020-04-25 ENCOUNTER — Other Ambulatory Visit: Payer: Self-pay | Admitting: Obstetrics and Gynecology

## 2020-04-25 DIAGNOSIS — Z780 Asymptomatic menopausal state: Secondary | ICD-10-CM

## 2020-05-23 DIAGNOSIS — H2513 Age-related nuclear cataract, bilateral: Secondary | ICD-10-CM | POA: Diagnosis not present

## 2020-06-06 DIAGNOSIS — Z23 Encounter for immunization: Secondary | ICD-10-CM | POA: Diagnosis not present

## 2020-07-15 DIAGNOSIS — I10 Essential (primary) hypertension: Secondary | ICD-10-CM | POA: Diagnosis not present

## 2020-07-15 DIAGNOSIS — G454 Transient global amnesia: Secondary | ICD-10-CM | POA: Diagnosis not present

## 2020-07-22 DIAGNOSIS — M159 Polyosteoarthritis, unspecified: Secondary | ICD-10-CM | POA: Diagnosis not present

## 2020-07-22 DIAGNOSIS — J4520 Mild intermittent asthma, uncomplicated: Secondary | ICD-10-CM | POA: Diagnosis not present

## 2020-07-22 DIAGNOSIS — I10 Essential (primary) hypertension: Secondary | ICD-10-CM | POA: Diagnosis not present

## 2020-07-22 DIAGNOSIS — N200 Calculus of kidney: Secondary | ICD-10-CM | POA: Diagnosis not present

## 2020-07-22 DIAGNOSIS — J309 Allergic rhinitis, unspecified: Secondary | ICD-10-CM | POA: Diagnosis not present

## 2020-07-22 DIAGNOSIS — I6523 Occlusion and stenosis of bilateral carotid arteries: Secondary | ICD-10-CM | POA: Diagnosis not present

## 2020-07-22 DIAGNOSIS — Z791 Long term (current) use of non-steroidal anti-inflammatories (NSAID): Secondary | ICD-10-CM | POA: Diagnosis not present

## 2020-07-22 DIAGNOSIS — Z0000 Encounter for general adult medical examination without abnormal findings: Secondary | ICD-10-CM | POA: Diagnosis not present

## 2020-07-30 ENCOUNTER — Other Ambulatory Visit: Payer: Self-pay

## 2020-07-30 ENCOUNTER — Ambulatory Visit
Admission: RE | Admit: 2020-07-30 | Discharge: 2020-07-30 | Disposition: A | Discharge status: Home / Self Care | Payer: Medicare Other | Source: Ambulatory Visit | Attending: Obstetrics and Gynecology | Admitting: Obstetrics and Gynecology

## 2020-07-30 DIAGNOSIS — Z780 Asymptomatic menopausal state: Secondary | ICD-10-CM | POA: Diagnosis not present

## 2020-08-15 DIAGNOSIS — Z20822 Contact with and (suspected) exposure to covid-19: Secondary | ICD-10-CM | POA: Diagnosis not present

## 2020-11-07 IMAGING — MG DIGITAL SCREENING BILATERAL MAMMOGRAM WITH TOMO AND CAD
8 series · 8 of 24 positions shown · non-contrast
Comparison: Previous exam(s).

CLINICAL DATA: Screening.

EXAM:
DIGITAL SCREENING BILATERAL MAMMOGRAM WITH TOMO AND CAD

[R MLO synth-2D]
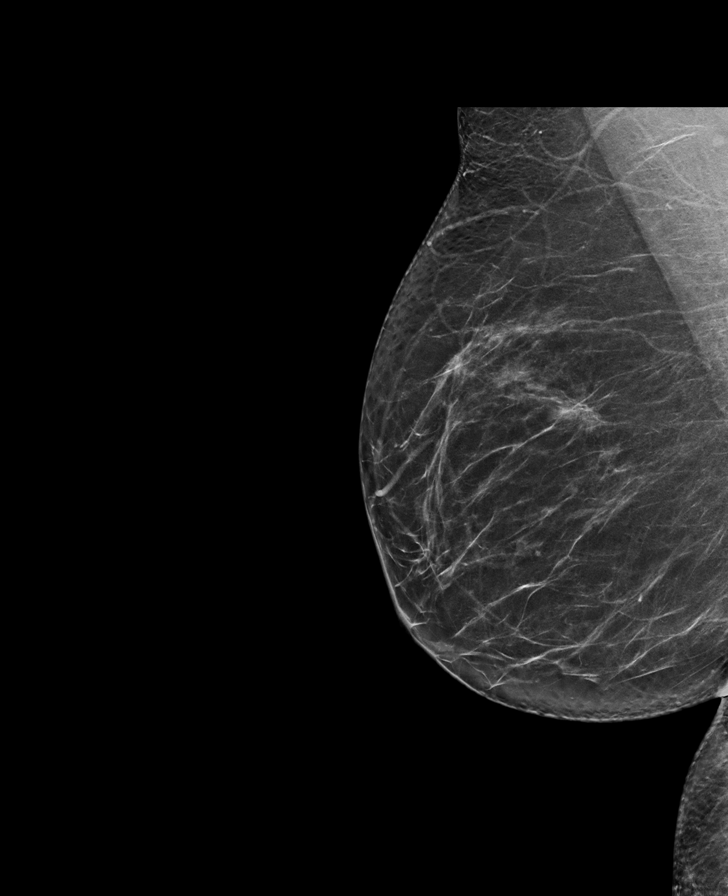

[L MLO synth-2D]
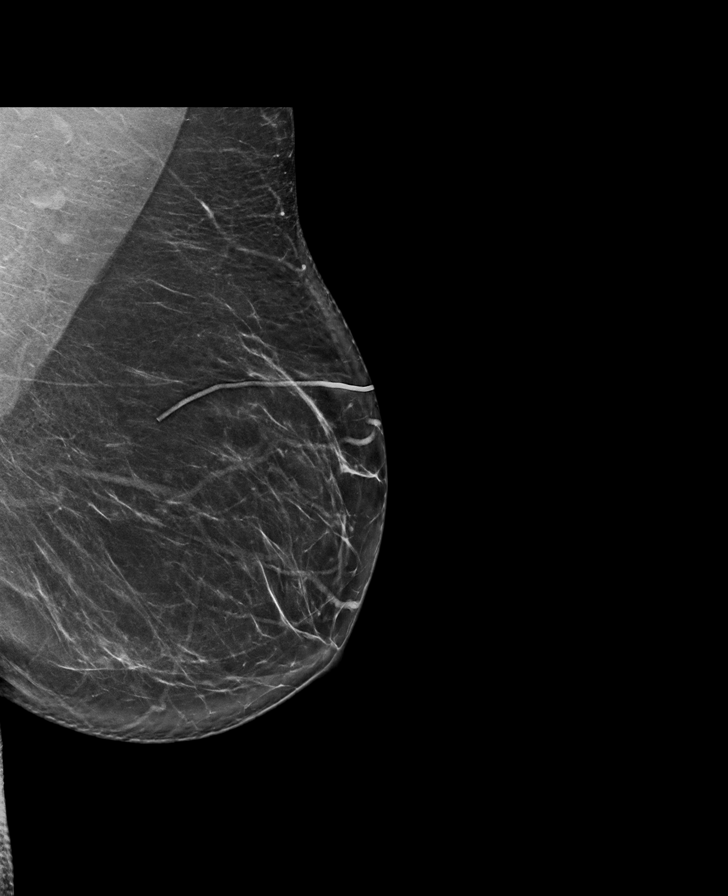

[L CC synth-2D]
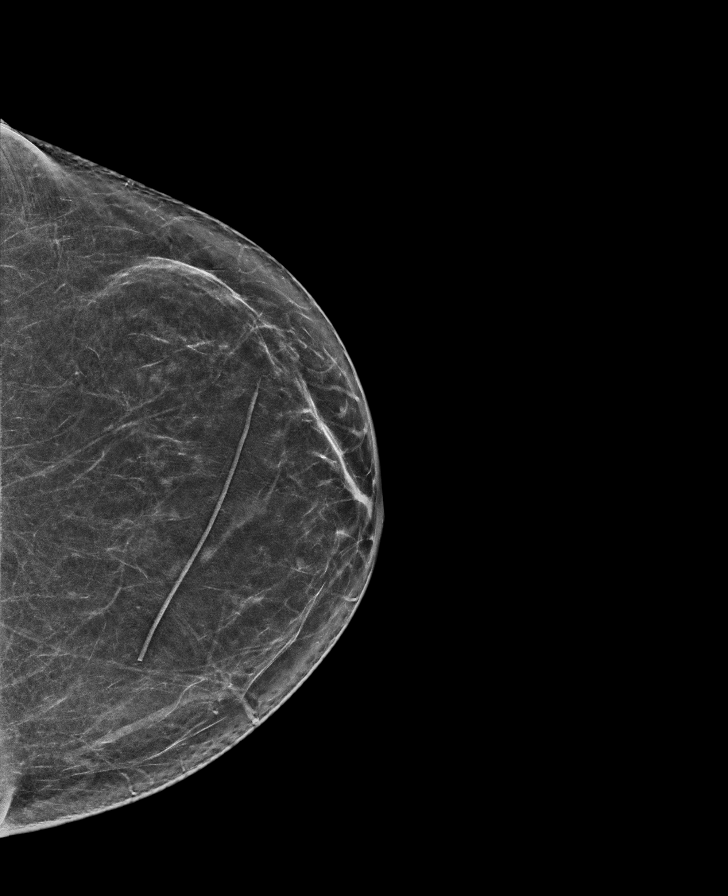

[R CC synth-2D]
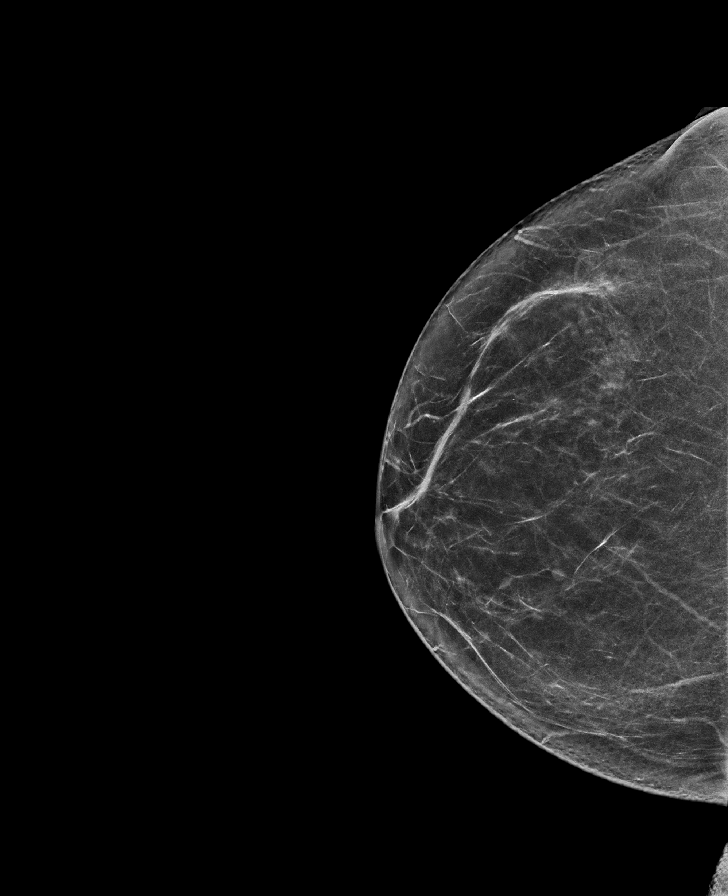

[L MLO tomo · tomo slice 41/82.0]
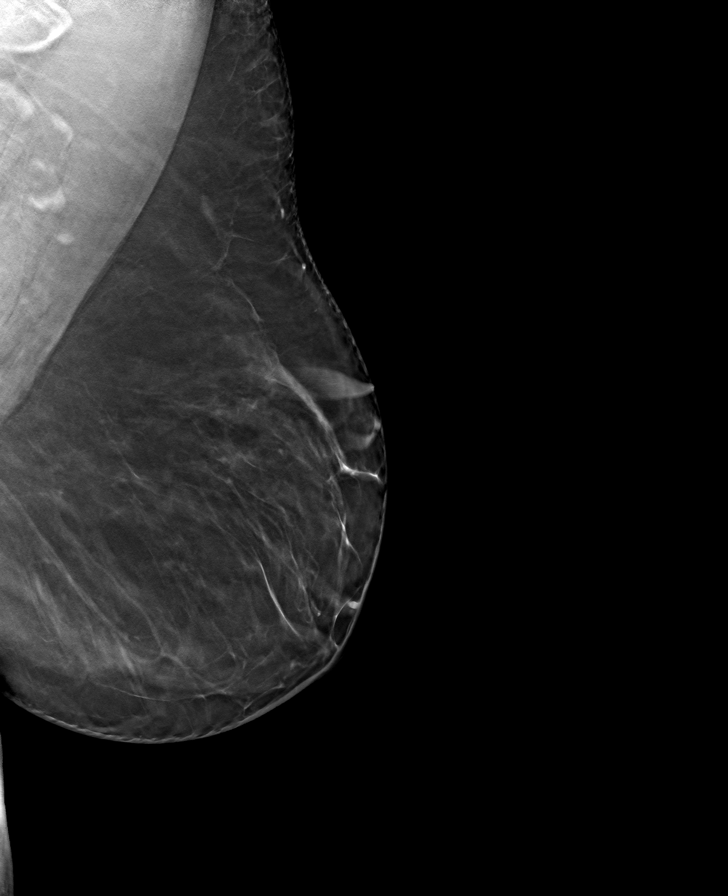

[R MLO tomo · tomo slice 39/76.0]
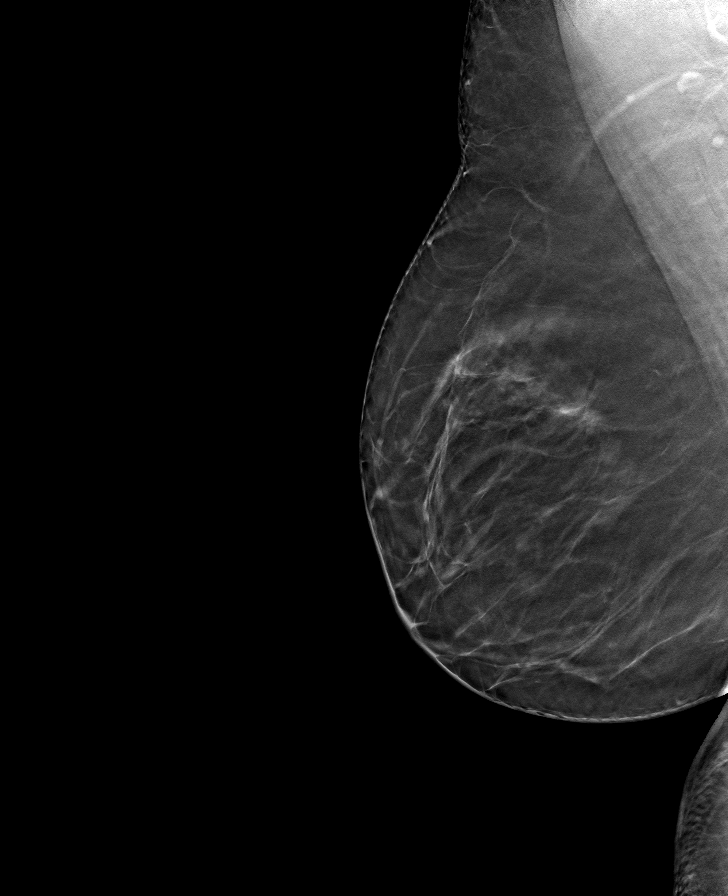

[L CC tomo · tomo slice 35/70.0]
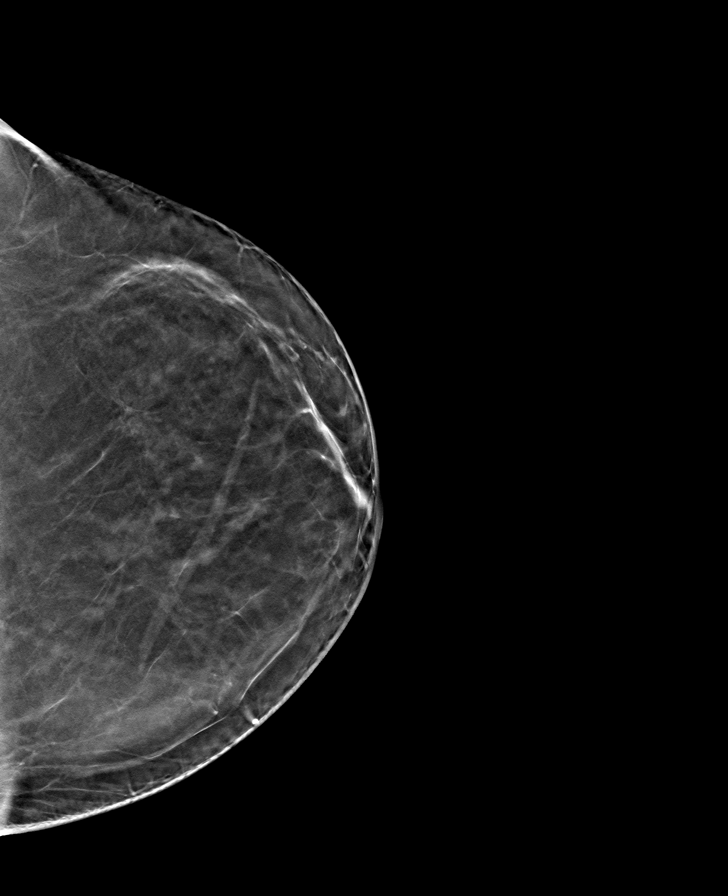

[R CC tomo · tomo slice 37/73.0]
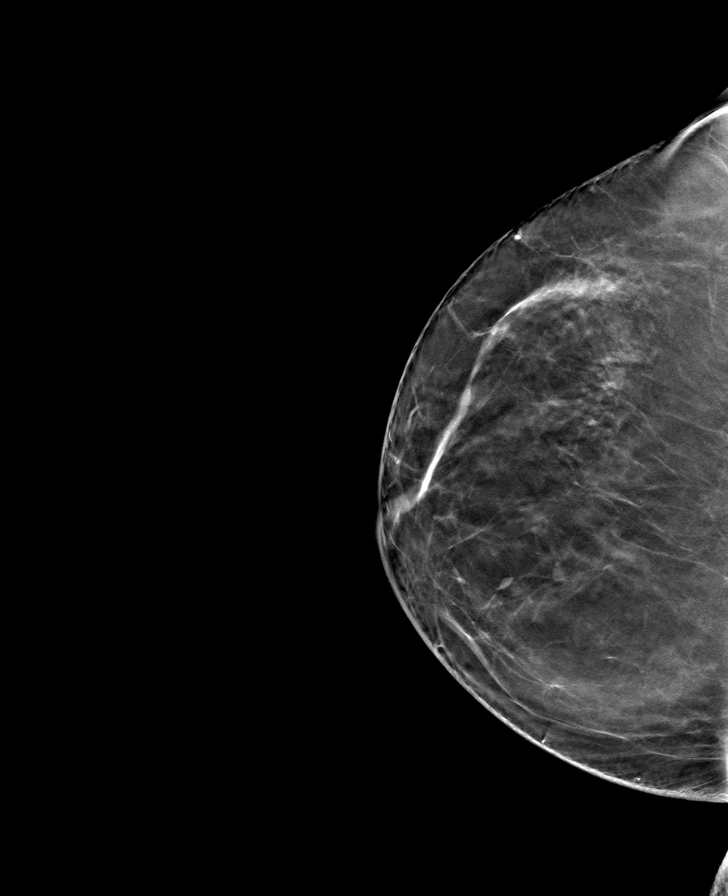

[8 of 24 positions shown; findings below may reference images not displayed]

ACR Breast Density Category b: There are scattered areas of
fibroglandular density.
FINDINGS: There are no findings suspicious for malignancy. Images were
processed with CAD.
IMPRESSION: No mammographic evidence of malignancy. A result letter of this
screening mammogram will be mailed directly to the patient.

RECOMMENDATION:
Screening mammogram in one year. (Code:[id number])

BI-RADS CATEGORY  1: Negative.

## 2020-11-19 ENCOUNTER — Other Ambulatory Visit: Payer: Self-pay | Admitting: Internal Medicine

## 2020-11-19 DIAGNOSIS — Z1231 Encounter for screening mammogram for malignant neoplasm of breast: Secondary | ICD-10-CM

## 2021-01-05 DIAGNOSIS — Z1231 Encounter for screening mammogram for malignant neoplasm of breast: Secondary | ICD-10-CM

## 2021-01-19 DIAGNOSIS — Z23 Encounter for immunization: Secondary | ICD-10-CM | POA: Diagnosis not present

## 2021-01-23 DIAGNOSIS — Z20822 Contact with and (suspected) exposure to covid-19: Secondary | ICD-10-CM | POA: Diagnosis not present

## 2021-02-19 ENCOUNTER — Other Ambulatory Visit: Payer: Self-pay

## 2021-02-19 ENCOUNTER — Ambulatory Visit
Admission: RE | Admit: 2021-02-19 | Discharge: 2021-02-19 | Disposition: A | Discharge status: Home / Self Care | Payer: Medicare Other | Source: Ambulatory Visit | Attending: Internal Medicine | Admitting: Internal Medicine

## 2021-02-19 DIAGNOSIS — Z1231 Encounter for screening mammogram for malignant neoplasm of breast: Secondary | ICD-10-CM | POA: Diagnosis not present

## 2021-04-15 DIAGNOSIS — Z20822 Contact with and (suspected) exposure to covid-19: Secondary | ICD-10-CM | POA: Diagnosis not present

## 2021-04-20 ENCOUNTER — Other Ambulatory Visit: Payer: Self-pay | Admitting: Dentistry

## 2021-04-20 DIAGNOSIS — M26631 Articular disc disorder of right temporomandibular joint: Secondary | ICD-10-CM

## 2021-04-23 ENCOUNTER — Ambulatory Visit
Admission: RE | Admit: 2021-04-23 | Discharge: 2021-04-23 | Disposition: A | Discharge status: Home / Self Care | Payer: Medicare Other | Source: Ambulatory Visit | Attending: Dentistry | Admitting: Dentistry

## 2021-04-23 ENCOUNTER — Other Ambulatory Visit: Payer: Self-pay

## 2021-04-23 DIAGNOSIS — M2669 Other specified disorders of temporomandibular joint: Secondary | ICD-10-CM | POA: Diagnosis not present

## 2021-04-23 DIAGNOSIS — M26631 Articular disc disorder of right temporomandibular joint: Secondary | ICD-10-CM

## 2021-04-23 DIAGNOSIS — S0303XA Dislocation of jaw, bilateral, initial encounter: Secondary | ICD-10-CM | POA: Diagnosis not present

## 2021-05-02 ENCOUNTER — Other Ambulatory Visit: Payer: Medicare Other

## 2021-05-16 DIAGNOSIS — Z23 Encounter for immunization: Secondary | ICD-10-CM | POA: Diagnosis not present

## 2021-06-01 DIAGNOSIS — Z23 Encounter for immunization: Secondary | ICD-10-CM | POA: Diagnosis not present

## 2021-07-03 DIAGNOSIS — Z20822 Contact with and (suspected) exposure to covid-19: Secondary | ICD-10-CM | POA: Diagnosis not present

## 2021-07-15 DIAGNOSIS — H2513 Age-related nuclear cataract, bilateral: Secondary | ICD-10-CM | POA: Diagnosis not present

## 2021-07-18 DIAGNOSIS — Z20822 Contact with and (suspected) exposure to covid-19: Secondary | ICD-10-CM | POA: Diagnosis not present

## 2021-07-22 DIAGNOSIS — I10 Essential (primary) hypertension: Secondary | ICD-10-CM | POA: Diagnosis not present

## 2021-07-27 DIAGNOSIS — I6523 Occlusion and stenosis of bilateral carotid arteries: Secondary | ICD-10-CM | POA: Diagnosis not present

## 2021-07-27 DIAGNOSIS — Z791 Long term (current) use of non-steroidal anti-inflammatories (NSAID): Secondary | ICD-10-CM | POA: Diagnosis not present

## 2021-07-27 DIAGNOSIS — I10 Essential (primary) hypertension: Secondary | ICD-10-CM | POA: Diagnosis not present

## 2021-07-27 DIAGNOSIS — J309 Allergic rhinitis, unspecified: Secondary | ICD-10-CM | POA: Diagnosis not present

## 2021-07-27 DIAGNOSIS — J4520 Mild intermittent asthma, uncomplicated: Secondary | ICD-10-CM | POA: Diagnosis not present

## 2021-07-27 DIAGNOSIS — M159 Polyosteoarthritis, unspecified: Secondary | ICD-10-CM | POA: Diagnosis not present

## 2021-07-27 DIAGNOSIS — Z0000 Encounter for general adult medical examination without abnormal findings: Secondary | ICD-10-CM | POA: Diagnosis not present

## 2021-07-27 DIAGNOSIS — Z23 Encounter for immunization: Secondary | ICD-10-CM | POA: Diagnosis not present

## 2021-07-28 ENCOUNTER — Other Ambulatory Visit: Payer: Self-pay | Admitting: Internal Medicine

## 2021-07-28 DIAGNOSIS — I10 Essential (primary) hypertension: Secondary | ICD-10-CM

## 2021-08-17 DIAGNOSIS — Z20822 Contact with and (suspected) exposure to covid-19: Secondary | ICD-10-CM | POA: Diagnosis not present

## 2021-08-21 ENCOUNTER — Ambulatory Visit
Admission: RE | Admit: 2021-08-21 | Discharge: 2021-08-21 | Disposition: A | Discharge status: Home / Self Care | Payer: No Typology Code available for payment source | Source: Ambulatory Visit | Attending: Internal Medicine | Admitting: Internal Medicine

## 2021-08-21 DIAGNOSIS — I10 Essential (primary) hypertension: Secondary | ICD-10-CM

## 2021-09-25 DIAGNOSIS — Z20822 Contact with and (suspected) exposure to covid-19: Secondary | ICD-10-CM | POA: Diagnosis not present

## 2021-09-28 DIAGNOSIS — M65311 Trigger thumb, right thumb: Secondary | ICD-10-CM | POA: Diagnosis not present

## 2021-09-28 DIAGNOSIS — Z01419 Encounter for gynecological examination (general) (routine) without abnormal findings: Secondary | ICD-10-CM | POA: Diagnosis not present

## 2021-09-29 DIAGNOSIS — M79641 Pain in right hand: Secondary | ICD-10-CM | POA: Diagnosis not present

## 2021-10-14 DIAGNOSIS — Z20822 Contact with and (suspected) exposure to covid-19: Secondary | ICD-10-CM | POA: Diagnosis not present

## 2021-11-11 DIAGNOSIS — Z20822 Contact with and (suspected) exposure to covid-19: Secondary | ICD-10-CM | POA: Diagnosis not present

## 2021-11-25 DIAGNOSIS — R350 Frequency of micturition: Secondary | ICD-10-CM | POA: Diagnosis not present

## 2021-12-12 DIAGNOSIS — Z20822 Contact with and (suspected) exposure to covid-19: Secondary | ICD-10-CM | POA: Diagnosis not present

## 2022-01-06 DIAGNOSIS — Z23 Encounter for immunization: Secondary | ICD-10-CM | POA: Diagnosis not present

## 2022-01-07 ENCOUNTER — Other Ambulatory Visit: Payer: Self-pay | Admitting: Internal Medicine

## 2022-01-07 DIAGNOSIS — Z1231 Encounter for screening mammogram for malignant neoplasm of breast: Secondary | ICD-10-CM

## 2022-02-15 DIAGNOSIS — M65311 Trigger thumb, right thumb: Secondary | ICD-10-CM | POA: Diagnosis not present

## 2022-03-03 DIAGNOSIS — Z1231 Encounter for screening mammogram for malignant neoplasm of breast: Secondary | ICD-10-CM

## 2022-03-11 ENCOUNTER — Ambulatory Visit
Admission: RE | Admit: 2022-03-11 | Discharge: 2022-03-11 | Disposition: A | Discharge status: Home / Self Care | Payer: Medicare Other | Source: Ambulatory Visit | Attending: Internal Medicine | Admitting: Internal Medicine

## 2022-03-11 DIAGNOSIS — Z1231 Encounter for screening mammogram for malignant neoplasm of breast: Secondary | ICD-10-CM

## 2022-05-26 DIAGNOSIS — M65311 Trigger thumb, right thumb: Secondary | ICD-10-CM | POA: Diagnosis not present

## 2022-06-07 DIAGNOSIS — Z23 Encounter for immunization: Secondary | ICD-10-CM | POA: Diagnosis not present

## 2022-06-14 DIAGNOSIS — M25641 Stiffness of right hand, not elsewhere classified: Secondary | ICD-10-CM | POA: Diagnosis not present

## 2022-06-18 DIAGNOSIS — Z23 Encounter for immunization: Secondary | ICD-10-CM | POA: Diagnosis not present

## 2022-07-26 DIAGNOSIS — I10 Essential (primary) hypertension: Secondary | ICD-10-CM | POA: Diagnosis not present

## 2022-07-29 DIAGNOSIS — I10 Essential (primary) hypertension: Secondary | ICD-10-CM | POA: Diagnosis not present

## 2022-07-29 DIAGNOSIS — Z0000 Encounter for general adult medical examination without abnormal findings: Secondary | ICD-10-CM | POA: Diagnosis not present

## 2022-07-29 DIAGNOSIS — Z9189 Other specified personal risk factors, not elsewhere classified: Secondary | ICD-10-CM | POA: Diagnosis not present

## 2022-07-29 DIAGNOSIS — J4520 Mild intermittent asthma, uncomplicated: Secondary | ICD-10-CM | POA: Diagnosis not present

## 2022-07-29 DIAGNOSIS — J309 Allergic rhinitis, unspecified: Secondary | ICD-10-CM | POA: Diagnosis not present

## 2022-07-29 DIAGNOSIS — M159 Polyosteoarthritis, unspecified: Secondary | ICD-10-CM | POA: Diagnosis not present

## 2022-07-29 DIAGNOSIS — I6523 Occlusion and stenosis of bilateral carotid arteries: Secondary | ICD-10-CM | POA: Diagnosis not present

## 2022-08-09 DIAGNOSIS — Z1212 Encounter for screening for malignant neoplasm of rectum: Secondary | ICD-10-CM | POA: Diagnosis not present

## 2022-08-09 DIAGNOSIS — Z1211 Encounter for screening for malignant neoplasm of colon: Secondary | ICD-10-CM | POA: Diagnosis not present

## 2022-08-17 LAB — COLOGUARD: COLOGUARD: NEGATIVE

## 2022-08-23 DIAGNOSIS — M1712 Unilateral primary osteoarthritis, left knee: Secondary | ICD-10-CM | POA: Diagnosis not present

## 2022-08-23 DIAGNOSIS — M25562 Pain in left knee: Secondary | ICD-10-CM | POA: Diagnosis not present

## 2022-08-31 DIAGNOSIS — M1712 Unilateral primary osteoarthritis, left knee: Secondary | ICD-10-CM | POA: Diagnosis not present

## 2022-09-02 DIAGNOSIS — M1712 Unilateral primary osteoarthritis, left knee: Secondary | ICD-10-CM | POA: Diagnosis not present

## 2022-09-02 DIAGNOSIS — M25562 Pain in left knee: Secondary | ICD-10-CM | POA: Diagnosis not present

## 2022-09-15 ENCOUNTER — Ambulatory Visit (INDEPENDENT_AMBULATORY_CARE_PROVIDER_SITE_OTHER): Payer: Medicare PPO | Admitting: Sports Medicine

## 2022-09-15 ENCOUNTER — Ambulatory Visit: Payer: Self-pay

## 2022-09-15 VITALS — BP 138/98 | Ht 62.5 in | Wt 195.0 lb

## 2022-09-15 DIAGNOSIS — M25562 Pain in left knee: Secondary | ICD-10-CM | POA: Diagnosis not present

## 2022-09-15 MED ORDER — TRAMADOL HCL 50 MG PO TABS: 50.0000 mg | ORAL_TABLET | Freq: Four times a day (QID) | ORAL | 0 refills | Status: AC | PRN

## 2022-09-15 NOTE — Assessment & Plan Note (Signed)
At this point patient has failed conservative management with corticosteroid injection and is ambulating with crutches as she feels her knee is unstable secondary to the pain she is having.  She has documented medial and lateral meniscal tears from an MRI performed on 09/02/2022.  I discussed with her conservative management including topical Voltaren gel continuing oral medications.  I have also sent tramadol 50 mg to her pharmacy to use for breakthrough pain.  Patient does have some component of osteoarthritis documented on MRI, in the patellofemoral compartment.  Upon review of outside records patient has only mild osteoarthritis of the knee joint.  Patient would like to go forth and discuss arthroscopic procedure with surgeon.  We referred her today to Dr. Ninfa Linden.  I will also discussed this case with Dr. Berenice Primas to see if he may be able to see her sooner.  Will follow-up with her after speaking with Dr. Berenice Primas. We also discussed with the patient possibility of Toradol injection before her vacation with her family if she is unable to have her procedure by then.  She verbalized understanding.

## 2022-09-15 NOTE — Progress Notes (Addendum)
Established Patient Office Visit  Subjective   Patient ID: Mckenzie Vasquez, female    DOB: 12-16-1950  Age: 72 y.o. MRN: [id number]  L knee pain.  Mrs. Mckenzie Vasquez presents today with chief complaint of left knee pain that has been bothering her since November.  She states she is having some achy discomfort in her knee however it worsened the middle of December when she stepped down funny off some stairs and she had an increase in her pain.  She was unable to bear weight for couple of days.  When she returned from her trip to [place] she was evaluated by an orthopedic surgeon and received a corticosteroid injection on 12/19.  She reports she got no relief from the shot.  She had an MRI performed that showed medial and lateral meniscal tears and small loose body and was referred for surgical intervention however she saw total joint specialist who stated she had mild arthritis which was not bad enough for arthroplasty.  She was then referred to a different surgeon within their group to discuss arthroscopy but that appointment is not for a few weeks.  She is here to discuss her options.   ROS as listed above in HPI    Objective:     BP (!) 138/98   Ht 5' 2.5" (1.588 m)   Wt 195 lb (88.5 kg)   BMI 35.10 kg/m   Physical Exam Vitals reviewed.  Constitutional:      General: She is not in acute distress.    Appearance: Normal appearance. She is obese. She is not ill-appearing, toxic-appearing or diaphoretic.  Pulmonary:     Effort: Pulmonary effort is normal.  Neurological:     Mental Status: She is alert.   Left knee: No obvious deformity or asymmetry.  She does have some puffiness around the anterior portion of her knee.  No ecchymosis. Tenderness to palpation near her medial joint line.  Active range of motion from about 0 to 85 degrees.  Stable to varus and valgus stress without pain.  Some discomfort with McMurray's but no clicking or catching.  Antalgic gait, ambulates with  crutches.  Limited ultrasound of the left knee Suprapatellar pouch, very small amount of hypoechoic fluid in the suprapatellar pouch. Anterior portion of the medial lateral meniscus were visualized.  There is a small amount of hypoechoic fluid surrounding the medial meniscus. Impression: No large effusion seen suprapatellar pouch.  There is some swelling around the medial meniscus.  She has documented evidence of meniscal tears on MRI.     Assessment & Plan:   Problem List Items Addressed This Visit       Other   Acute pain of left knee - Primary    At this point patient has failed conservative management with corticosteroid injection and is ambulating with crutches as she feels her knee is unstable secondary to the pain she is having.  She has documented medial and lateral meniscal tears from an MRI performed on 09/02/2022.  I discussed with her conservative management including topical Voltaren gel continuing oral medications.  I have also sent tramadol 50 mg to her pharmacy to use for breakthrough pain.  Patient does have some component of osteoarthritis documented on MRI, in the patellofemoral compartment.  Upon review of outside records patient has only mild osteoarthritis of the knee joint.  Patient would like to go forth and discuss arthroscopic procedure with surgeon.  We referred her today to Dr. Ninfa Linden.  I will also discussed  this case with Dr. Berenice Primas to see if he may be able to see her sooner.  Will follow-up with her after speaking with Dr. Berenice Primas. We also discussed with the patient possibility of Toradol injection before her vacation with her family if she is unable to have her procedure by then.  She verbalized understanding.      Relevant Orders   [place] LIMITED JOINT SPACE STRUCTURES LOW LEFT   Ambulatory referral to Orthopedic Surgery    Return if symptoms worsen or fail to improve.    Elmore Guise, DO  I was the preceptor for this visit and available for immediate  consultation Shellia Cleverly, DO

## 2022-09-22 ENCOUNTER — Ambulatory Visit: Payer: Medicare PPO | Admitting: Orthopaedic Surgery

## 2023-02-01 ENCOUNTER — Other Ambulatory Visit: Payer: Self-pay | Admitting: Internal Medicine

## 2023-02-01 DIAGNOSIS — Z1231 Encounter for screening mammogram for malignant neoplasm of breast: Secondary | ICD-10-CM

## 2023-03-21 ENCOUNTER — Ambulatory Visit
Admission: RE | Admit: 2023-03-21 | Discharge: 2023-03-21 | Disposition: A | Discharge status: Home / Self Care | Payer: Medicare PPO | Source: Ambulatory Visit | Attending: Internal Medicine | Admitting: Internal Medicine

## 2023-03-21 ENCOUNTER — Ambulatory Visit: Payer: Medicare PPO

## 2023-03-21 DIAGNOSIS — Z1231 Encounter for screening mammogram for malignant neoplasm of breast: Secondary | ICD-10-CM

## 2023-03-22 ENCOUNTER — Other Ambulatory Visit: Payer: Self-pay | Admitting: Internal Medicine

## 2023-03-22 DIAGNOSIS — R928 Other abnormal and inconclusive findings on diagnostic imaging of breast: Secondary | ICD-10-CM

## 2023-03-24 ENCOUNTER — Ambulatory Visit
Admission: RE | Admit: 2023-03-24 | Discharge: 2023-03-24 | Disposition: A | Discharge status: Home / Self Care | Payer: Medicare PPO | Source: Ambulatory Visit | Attending: Internal Medicine | Admitting: Internal Medicine

## 2023-03-24 DIAGNOSIS — R928 Other abnormal and inconclusive findings on diagnostic imaging of breast: Secondary | ICD-10-CM

## 2023-08-04 DIAGNOSIS — N200 Calculus of kidney: Secondary | ICD-10-CM | POA: Diagnosis not present

## 2023-08-04 DIAGNOSIS — I10 Essential (primary) hypertension: Secondary | ICD-10-CM | POA: Diagnosis not present

## 2023-08-16 DIAGNOSIS — I6523 Occlusion and stenosis of bilateral carotid arteries: Secondary | ICD-10-CM | POA: Diagnosis not present

## 2023-08-16 DIAGNOSIS — M542 Cervicalgia: Secondary | ICD-10-CM | POA: Diagnosis not present

## 2023-08-16 DIAGNOSIS — Z0000 Encounter for general adult medical examination without abnormal findings: Secondary | ICD-10-CM | POA: Diagnosis not present

## 2023-08-16 DIAGNOSIS — R7989 Other specified abnormal findings of blood chemistry: Secondary | ICD-10-CM | POA: Diagnosis not present

## 2023-08-16 DIAGNOSIS — G4733 Obstructive sleep apnea (adult) (pediatric): Secondary | ICD-10-CM | POA: Diagnosis not present

## 2023-08-16 DIAGNOSIS — J4520 Mild intermittent asthma, uncomplicated: Secondary | ICD-10-CM | POA: Diagnosis not present

## 2023-08-16 DIAGNOSIS — J309 Allergic rhinitis, unspecified: Secondary | ICD-10-CM | POA: Diagnosis not present

## 2023-08-16 DIAGNOSIS — I10 Essential (primary) hypertension: Secondary | ICD-10-CM | POA: Diagnosis not present

## 2023-08-16 DIAGNOSIS — R8271 Bacteriuria: Secondary | ICD-10-CM | POA: Diagnosis not present

## 2023-08-23 DIAGNOSIS — M47892 Other spondylosis, cervical region: Secondary | ICD-10-CM | POA: Diagnosis not present

## 2023-08-24 DIAGNOSIS — M47892 Other spondylosis, cervical region: Secondary | ICD-10-CM | POA: Diagnosis not present

## 2023-08-29 DIAGNOSIS — M47892 Other spondylosis, cervical region: Secondary | ICD-10-CM | POA: Diagnosis not present

## 2023-09-02 DIAGNOSIS — I10 Essential (primary) hypertension: Secondary | ICD-10-CM | POA: Diagnosis not present

## 2023-09-02 DIAGNOSIS — M47892 Other spondylosis, cervical region: Secondary | ICD-10-CM | POA: Diagnosis not present

## 2023-09-06 DIAGNOSIS — M47892 Other spondylosis, cervical region: Secondary | ICD-10-CM | POA: Diagnosis not present

## 2023-09-12 DIAGNOSIS — M47892 Other spondylosis, cervical region: Secondary | ICD-10-CM | POA: Diagnosis not present

## 2023-09-19 DIAGNOSIS — M47892 Other spondylosis, cervical region: Secondary | ICD-10-CM | POA: Diagnosis not present

## 2023-09-22 DIAGNOSIS — M47892 Other spondylosis, cervical region: Secondary | ICD-10-CM | POA: Diagnosis not present

## 2023-09-27 DIAGNOSIS — M47892 Other spondylosis, cervical region: Secondary | ICD-10-CM | POA: Diagnosis not present

## 2023-09-29 DIAGNOSIS — M47892 Other spondylosis, cervical region: Secondary | ICD-10-CM | POA: Diagnosis not present

## 2023-10-04 DIAGNOSIS — M47892 Other spondylosis, cervical region: Secondary | ICD-10-CM | POA: Diagnosis not present

## 2023-10-06 DIAGNOSIS — M47892 Other spondylosis, cervical region: Secondary | ICD-10-CM | POA: Diagnosis not present

## 2023-10-11 DIAGNOSIS — M47892 Other spondylosis, cervical region: Secondary | ICD-10-CM | POA: Diagnosis not present

## 2023-10-13 DIAGNOSIS — M47892 Other spondylosis, cervical region: Secondary | ICD-10-CM | POA: Diagnosis not present

## 2023-10-18 DIAGNOSIS — M47892 Other spondylosis, cervical region: Secondary | ICD-10-CM | POA: Diagnosis not present

## 2024-01-04 DIAGNOSIS — Z1331 Encounter for screening for depression: Secondary | ICD-10-CM | POA: Diagnosis not present

## 2024-01-04 DIAGNOSIS — Z779 Other contact with and (suspected) exposures hazardous to health: Secondary | ICD-10-CM | POA: Diagnosis not present

## 2024-02-27 ENCOUNTER — Other Ambulatory Visit: Payer: Self-pay | Admitting: Internal Medicine

## 2024-02-27 DIAGNOSIS — Z1231 Encounter for screening mammogram for malignant neoplasm of breast: Secondary | ICD-10-CM

## 2024-03-10 DIAGNOSIS — K649 Unspecified hemorrhoids: Secondary | ICD-10-CM | POA: Diagnosis not present

## 2024-03-12 ENCOUNTER — Encounter: Payer: Self-pay | Admitting: Gastroenterology

## 2024-03-28 ENCOUNTER — Ambulatory Visit
Admission: RE | Admit: 2024-03-28 | Discharge: 2024-03-28 | Disposition: A | Discharge status: Home / Self Care | Source: Ambulatory Visit

## 2024-03-28 DIAGNOSIS — Z1231 Encounter for screening mammogram for malignant neoplasm of breast: Secondary | ICD-10-CM | POA: Diagnosis not present

## 2024-05-02 ENCOUNTER — Ambulatory Visit: Admitting: Gastroenterology

## 2024-05-02 ENCOUNTER — Encounter: Payer: Self-pay | Admitting: Gastroenterology

## 2024-05-02 VITALS — BP 104/60 | HR 84 | Ht 61.25 in | Wt 135.5 lb

## 2024-05-02 DIAGNOSIS — R198 Other specified symptoms and signs involving the digestive system and abdomen: Secondary | ICD-10-CM

## 2024-05-02 DIAGNOSIS — R194 Change in bowel habit: Secondary | ICD-10-CM

## 2024-05-02 DIAGNOSIS — K649 Unspecified hemorrhoids: Secondary | ICD-10-CM | POA: Diagnosis not present

## 2024-05-02 MED ORDER — HYDROCORTISONE ACETATE 25 MG RE SUPP: 25.0000 mg | Freq: Every day | RECTAL | 0 refills | Status: AC

## 2024-05-02 NOTE — Progress Notes (Unsigned)
 [name] [id number] [birth year]   Chief Complaint:  Referring Provider: Clarice Nottingham, MD Primary GI MD: Dr. Avram  HPI: Mckenzie Vasquez is a 73 y.o. female with past medical history of asthma, GERD, diverticulitis, HTN, cholecystectomy who presents today for a complaint of hemorrhoids.    Last colonoscopy performed in 2013.  Normal left colon but was unable to tolerate exam beyond that point.  Recommendation was to schedule office visit to discuss abdominal pain and consider repeating colonoscopy exam with MAC sedation.  Has not been seen in the office since then.  Had a negative Cologuard test 08/03/2019 and 08/02/2022.  Unable to view outside PCP records but was seen in June for diagnosis of hemorrhoids.   Had episode in June of swelling, itching, protrusion, no bleeding Has alternating constipation and diarrhea (on Mounjaro) Family history of colon cancer? Maternal grandfather, dx in 79s or 93s  Had hemorrhoid flare up, itching/swelling Otc hydrocortisone  cream didn't help, was at the beach, went to urgent care Got steroid shot at [facility], had fungal infection after that They don't flare up very often Itching was severe, no pain Can feel something protrude when swollen Used lotrimin, was in [place]  Has always had alternating constipation and diarrhea, worse on Mounjaro Takes colace every day Has endometrial scarring, trouble passing stool unless soft Has been taking fiber supplements as well  No abd pain, nausea/vomiting    Previous GI Procedures/Imaging   Colonoscopy 11/16/2011 - Normal left colon, did not tolerate exam beyond this point - No left-sided diverticulosis seen - Recommendation was to schedule an office visit to discuss abdominal pain and to consider repeating colonoscopy exam with MAC sedation.  EGD 03/11/2004 Hiatal hernia, Barrett's esophagus with length of 3 cm suspected (path showed EG junction mucosa with mild inflammation, no intestinal  metaplasia, dysplasia, or malignancy)  Past Medical History:  Diagnosis Date   Arthritis    Asthma    Diverticulitis large intestine 2012   pt. states this is ongoing and sometimes worse than others   GERD (gastroesophageal reflux disease)    Hypertension     Past Surgical History:  Procedure Laterality Date   BREAST BIOPSY     left   BREAST EXCISIONAL BIOPSY Left    benign   CHOLECYSTECTOMY     COLONOSCOPY     ESOPHAGOGASTRODUODENOSCOPY     EXPLORATORY LAPAROTOMY     x2; oophorectomyx1   LAPAROSCOPIC ENDOMETRIOSIS FULGURATION  1997   x3   LAPAROTOMY  1997   OOPHORECTOMY     TONSILLECTOMY      Current Outpatient Medications  Medication Sig Dispense Refill   albuterol  (PROAIR  HFA) 108 (90 BASE) MCG/ACT inhaler Inhale 2 puffs into the lungs every 4 (four) hours as needed. 1 Inhaler 12   aspirin  EC 81 MG tablet Take 1 tablet (81 mg total) by mouth daily. (Patient not taking: Reported on 06/12/2019) 30 tablet 0   clarithromycin  (BIAXIN ) 500 MG tablet Take 1 tablet (500 mg total) by mouth 2 (two) times daily. (Patient not taking: Reported on 06/12/2019) 10 tablet 0   Estradiol (VAGIFEM VA) Place 1 application vaginally daily.     Fluticasone -Salmeterol (ADVAIR DISKUS) 250-50 MCG/DOSE AEPB Inhale 1 puff into the lungs 2 (two) times daily as needed.     HYDROcodone -homatropine (HYCODAN) 5-1.5 MG/5ML syrup Take 5 mLs by mouth every 8 (eight) hours as needed for cough. (Patient not taking: Reported on 06/12/2019) 120 mL 0   hydrocortisone  (ANUCORT-HC ) 25 MG suppository Place 1  suppository (25 mg total) rectally 2 (two) times daily. PATIENT NEEDS OFFICE VISIT FOR ADDITIONAL REFILLS (Patient not taking: Reported on 06/12/2019) 28 suppository 0   lisinopril-hydrochlorothiazide (PRINZIDE,ZESTORETIC) 10-12.5 MG per tablet TAKE 1 TABLET ONCE DAILY. 30 tablet 2   meloxicam  (MOBIC ) 15 MG tablet Take 1 tablet (15 mg total) by mouth daily. 30 tablet 0   OPTIVAR 0.05 % ophthalmic solution INSTILL 1  DROP IN EACH EYE TWICE DAILY. 6 mL 0   PROCTOZONE -HC 2.5 % rectal cream APPLY RECTALLY TWICE DAILY AS DIRECTED. 30 g 3   rosuvastatin (CRESTOR) 10 MG tablet Take 10 mg by mouth daily.     No current facility-administered medications for this visit.   Facility-Administered Medications Ordered in Other Visits  Medication Dose Route Frequency Provider Last Rate Last Admin   gadopentetate dimeglumine  (MAGNEVIST ) injection 15 mL  15 mL Intravenous Once PRN Ines Onetha NOVAK, MD       gadopentetate dimeglumine  (MAGNEVIST ) injection 15 mL  15 mL Intravenous Once PRN Ines Onetha NOVAK, MD        Allergies as of 05/02/2024 - Review Complete 09/15/2022  Allergen Reaction Noted   Sulfa antibiotics Rash 11/05/2011    Family History  Problem Relation Age of Onset   Colon cancer Maternal Grandfather    Colon cancer Other    Colon cancer Paternal Grandfather    Stroke Paternal Grandfather    Alzheimer's disease Mother    COPD Father    Stroke Father    Anxiety disorder Daughter     Social History   Tobacco Use   Smoking status: Never   Smokeless tobacco: Never  Vaping Use   Vaping status: Never Used  Substance Use Topics   Alcohol use: No    Alcohol/week: 0.0 standard drinks of alcohol   Drug use: No     Review of Systems:    Constitutional: No weight loss, fever, chills, weakness or fatigue Eyes: No change in vision Ears, Nose, Throat:  No change in hearing or congestion Skin: No rash or itching Cardiovascular: No chest pain, chest pressure or palpitations   Respiratory: No SOB or cough Gastrointestinal: See HPI and otherwise negative Genitourinary: No dysuria or change in urinary frequency Neurological: No headache, dizziness or syncope Musculoskeletal: No new muscle or joint pain Hematologic: No bleeding or bruising    Physical Exam:  Vital signs: There were no vitals taken for this visit.  Constitutional: NAD, Well developed, Well nourished, alert and cooperative Head:   Normocephalic and atraumatic.  Eyes: No scleral icterus. Conjunctiva pink. Mouth: No oral lesions. Respiratory: Respirations even and unlabored. Lungs clear to auscultation bilaterally.  No wheezes, crackles, or rhonchi.  Cardiovascular:  Regular rate and rhythm. No murmurs. No peripheral edema. Gastrointestinal:  Soft, nondistended, nontender. No rebound or guarding. Normal bowel sounds. No appreciable masses or hepatomegaly. Rectal:  Not performed.  Neurologic:  Alert and oriented x4;  grossly normal neurologically.  Skin:   Dry and intact without significant lesions or rashes. Psychiatric: Oriented to person, place and time. Demonstrates good judgement and reason without abnormal affect or behaviors.   RELEVANT LABS AND IMAGING: CBC    Component Value Date/Time   WBC 12.4 (A) 01/24/2018 1719   WBC 8.4 05/27/2017 2104   RBC 4.95 01/24/2018 1719   RBC 4.93 05/27/2017 2104   HGB 14.5 01/24/2018 1719   HGB 14.9 05/27/2017 2104   HCT 42.9 01/24/2018 1719   HCT 42.6 05/27/2017 2104   PLT 266 05/27/2017 2104  MCV 86.7 01/24/2018 1719   MCH 29.3 01/24/2018 1719   MCH 30.2 05/27/2017 2104   MCHC 33.8 01/24/2018 1719   MCHC 35.0 05/27/2017 2104   RDW 12.4 05/27/2017 2104    CMP     Component Value Date/Time   NA 141 05/27/2017 2104   K 3.7 05/27/2017 2104   CL 105 05/27/2017 2104   CO2 27 05/27/2017 2104   GLUCOSE 90 05/27/2017 2104   BUN 16 05/27/2017 2104   CREATININE 0.76 05/27/2017 2104   CREATININE 0.87 11/07/2012 1936   CALCIUM 9.9 05/27/2017 2104   PROT 6.8 05/27/2017 2104   ALBUMIN 4.5 05/27/2017 2104   AST 22 05/27/2017 2104   ALT 26 05/27/2017 2104   ALKPHOS 40 05/27/2017 2104   BILITOT 0.7 05/27/2017 2104   GFRNONAA >60 05/27/2017 2104   GFRAA >60 05/27/2017 2104     Assessment/Plan:   Nonbleeding nonthrombosed external hems/skin tags Maybe small internal hemorrhoids as well on anoscopy No bleeding No symptomatic at this time If recurrence of sx can  send topical hydrocortisone  cream or suppositories Not having any bleeding Follow up as needed Anusol  suppositories once nightly 7-10 days to have on hand    Camie Furbish, PA-C [facility] 05/02/2024, 12:52 PM  Patient Care Team: Clarice Nottingham, MD as PCP - General (Internal Medicine)

## 2024-05-02 NOTE — Patient Instructions (Addendum)
 Follow up as needed.   We have sent the following medications to your pharmacy for you to pick up at your convenience: Hydrocortisone  suppositories  _______________________________________________________  If your blood pressure at your visit was 140/90 or greater, please contact your primary care physician to follow up on this.  _______________________________________________________  If you are age 71 or older, your body mass index should be between 23-30. Your Body mass index is 25.39 kg/m. If this is out of the aforementioned range listed, please consider follow up with your Primary Care Provider.  If you are age 32 or younger, your body mass index should be between 19-25. Your Body mass index is 25.39 kg/m. If this is out of the aformentioned range listed, please consider follow up with your Primary Care Provider.   ________________________________________________________  The [facility] providers would like to encourage you to use MYCHART to communicate with providers for non-urgent requests or questions.  Due to long hold times on the telephone, sending your provider a message by [facility] may be a faster and more efficient way to get a response.  Please allow 48 business hours for a response.  Please remember that this is for non-urgent requests.  _______________________________________________________  [facility] is using a team-based approach to care.  Your team is made up of your doctor and two to three APPS. Our APPS (Nurse Practitioners and Physician Assistants) work with your physician to ensure care continuity for you. They are fully qualified to address your health concerns and develop a treatment plan. They communicate directly with your gastroenterologist to care for you. Seeing the Advanced Practice Practitioners on your physician's team can help you by facilitating care more promptly, often allowing for earlier appointments, access to diagnostic testing,  procedures, and other specialty referrals.

## 2024-05-04 ENCOUNTER — Encounter: Payer: Self-pay | Admitting: Gastroenterology

## 2024-08-06 DIAGNOSIS — I6523 Occlusion and stenosis of bilateral carotid arteries: Secondary | ICD-10-CM | POA: Diagnosis not present

## 2024-08-17 DIAGNOSIS — Z6837 Body mass index (BMI) 37.0-37.9, adult: Secondary | ICD-10-CM | POA: Diagnosis not present

## 2024-08-17 DIAGNOSIS — G4733 Obstructive sleep apnea (adult) (pediatric): Secondary | ICD-10-CM | POA: Diagnosis not present

## 2024-08-21 ENCOUNTER — Other Ambulatory Visit (HOSPITAL_COMMUNITY): Payer: Self-pay

## 2024-08-21 MED ORDER — TIRZEPATIDE-WEIGHT MANAGEMENT 5 MG/0.5ML ~~LOC~~ SOAJ
5.0000 mg | SUBCUTANEOUS | 3 refills | Status: AC
  Filled 2024-08-21: qty 2, 28d supply, fill #0
  Filled 2024-09-13: qty 2, 28d supply, fill #1

## 2024-09-14 ENCOUNTER — Other Ambulatory Visit: Payer: Self-pay

## 2024-09-14 ENCOUNTER — Other Ambulatory Visit (HOSPITAL_COMMUNITY): Payer: Self-pay

## 2024-09-14 MED ORDER — ZEPBOUND 7.5 MG/0.5ML ~~LOC~~ SOAJ
7.5000 mg | SUBCUTANEOUS | 3 refills | Status: AC
  Filled 2024-09-14 – 2024-09-18 (×4): qty 2, 28d supply, fill #0
  Filled 2024-10-12: qty 2, 28d supply, fill #1

## 2024-09-18 ENCOUNTER — Other Ambulatory Visit: Payer: Self-pay

## 2024-09-18 ENCOUNTER — Other Ambulatory Visit (HOSPITAL_COMMUNITY): Payer: Self-pay

## 2024-10-10 ENCOUNTER — Other Ambulatory Visit (HOSPITAL_BASED_OUTPATIENT_CLINIC_OR_DEPARTMENT_OTHER): Payer: Self-pay

## 2024-10-10 MED ORDER — ZEPBOUND 10 MG/0.5ML ~~LOC~~ SOAJ
10.0000 mg | SUBCUTANEOUS | 5 refills | Status: AC
  Filled 2024-10-10 – 2024-10-12 (×3): qty 2, 28d supply, fill #0

## 2024-10-12 ENCOUNTER — Other Ambulatory Visit (HOSPITAL_COMMUNITY): Payer: Self-pay

## 2024-10-12 ENCOUNTER — Other Ambulatory Visit (HOSPITAL_BASED_OUTPATIENT_CLINIC_OR_DEPARTMENT_OTHER): Payer: Self-pay
